# Patient Record
Sex: Male | Born: 1980 | Race: White | Hispanic: No | Marital: Married | Smoking: Current every day smoker
Health system: Southern US, Community
[De-identification: ages and names within clinical notes are randomized; demographics above are authoritative.]

## PROBLEM LIST (undated history)

## (undated) DIAGNOSIS — I10 Essential (primary) hypertension: Secondary | ICD-10-CM

## (undated) DIAGNOSIS — F909 Attention-deficit hyperactivity disorder, unspecified type: Secondary | ICD-10-CM

## (undated) DIAGNOSIS — E119 Type 2 diabetes mellitus without complications: Secondary | ICD-10-CM

## (undated) DIAGNOSIS — F419 Anxiety disorder, unspecified: Secondary | ICD-10-CM

## (undated) HISTORY — DX: Essential (primary) hypertension: I10

## (undated) HISTORY — DX: Attention-deficit hyperactivity disorder, unspecified type: F90.9

## (undated) HISTORY — DX: Type 2 diabetes mellitus without complications: E11.9

## (undated) HISTORY — DX: Anxiety disorder, unspecified: F41.9

---

## 2020-12-28 ENCOUNTER — Other Ambulatory Visit: Payer: Self-pay

## 2020-12-28 ENCOUNTER — Ambulatory Visit (INDEPENDENT_AMBULATORY_CARE_PROVIDER_SITE_OTHER): Payer: Self-pay

## 2020-12-28 ENCOUNTER — Ambulatory Visit (HOSPITAL_COMMUNITY)
Admission: EM | Admit: 2020-12-28 | Discharge: 2020-12-28 | Disposition: A | Discharge status: Home / Self Care | Payer: Self-pay | Attending: Urgent Care | Admitting: Urgent Care

## 2020-12-28 ENCOUNTER — Encounter (HOSPITAL_COMMUNITY): Payer: Self-pay | Admitting: *Deleted

## 2020-12-28 DIAGNOSIS — R059 Cough, unspecified: Secondary | ICD-10-CM

## 2020-12-28 DIAGNOSIS — B9789 Other viral agents as the cause of diseases classified elsewhere: Secondary | ICD-10-CM

## 2020-12-28 DIAGNOSIS — R051 Acute cough: Secondary | ICD-10-CM

## 2020-12-28 DIAGNOSIS — J988 Other specified respiratory disorders: Secondary | ICD-10-CM

## 2020-12-28 DIAGNOSIS — R062 Wheezing: Secondary | ICD-10-CM

## 2020-12-28 DIAGNOSIS — R0789 Other chest pain: Secondary | ICD-10-CM

## 2020-12-28 MED ORDER — ALBUTEROL SULFATE HFA 108 (90 BASE) MCG/ACT IN AERS: 1.0000 | INHALATION_SPRAY | Freq: Four times a day (QID) | RESPIRATORY_TRACT | 0 refills | Status: AC | PRN

## 2020-12-28 MED ORDER — PROMETHAZINE-DM 6.25-15 MG/5ML PO SYRP: 5.0000 mL | ORAL_SOLUTION | Freq: Every evening | ORAL | 0 refills | Status: DC | PRN

## 2020-12-28 MED ORDER — PREDNISONE 20 MG PO TABS: ORAL_TABLET | ORAL | 0 refills | Status: DC

## 2020-12-28 MED ORDER — BENZONATATE 100 MG PO CAPS: 100.0000 mg | ORAL_CAPSULE | Freq: Three times a day (TID) | ORAL | 0 refills | Status: DC | PRN

## 2020-12-28 NOTE — ED Provider Notes (Signed)
  [facility]   MRN: [id number] DOB: 06/26/1980  Subjective:   Jordan Madden is a 40 y.o. male presenting for 10-day history of acute onset persistent coughing, coughing fits, wheezing, chest tightness and chest pain especially with a cough.  Did an at-home COVID test and was negative.  Does not want a repeat.  He is a smoker, does 1/2ppd.  No history of COPD, asthma.  Reports that normally he has blood pressure runs high and also his pulse.  No body aches, sinus pain, facial pain.  No current facility-administered medications for this encounter. No current outpatient medications on file.   No Known Allergies  History reviewed. No pertinent past medical history.   History reviewed. No pertinent surgical history.  History reviewed. No pertinent family history.  Social History   Tobacco Use   Smoking status: Every Day   Smokeless tobacco: Never    ROS   Objective:   Vitals: BP (!) 146/104   Pulse (!) 113   Temp 99.2 F (37.3 C)   Resp 20   SpO2 94%   Physical Exam Constitutional:      General: He is not in acute distress.    Appearance: Normal appearance. He is well-developed. He is not ill-appearing, toxic-appearing or diaphoretic.  HENT:     Head: Normocephalic and atraumatic.     Right Ear: External ear normal.     Left Ear: External ear normal.     Nose: Nose normal.     Mouth/Throat:     Mouth: Mucous membranes are moist.     Pharynx: Oropharynx is clear.  Eyes:     General: No scleral icterus.    Extraocular Movements: Extraocular movements intact.     Pupils: Pupils are equal, round, and reactive to light.  Cardiovascular:     Rate and Rhythm: Normal rate and regular rhythm.     Heart sounds: Normal heart sounds. No murmur heard.   No friction rub. No gallop.  Pulmonary:     Effort: Pulmonary effort is normal. No respiratory distress.     Breath sounds: No stridor. Rhonchi (bilateral) present. No wheezing or rales.  Neurological:      Mental Status: He is alert and oriented to person, place, and time.  Psychiatric:        Mood and Affect: Mood normal.        Behavior: Behavior normal.        Thought Content: Thought content normal.    DG Chest 2 View  Result Date: 12/28/2020 CLINICAL DATA:  Coughing and wheezing for 10 days EXAM: CHEST - 2 VIEW COMPARISON:  None. FINDINGS: The heart size and mediastinal contours are within normal limits. Both lungs are clear. The visualized skeletal structures are unremarkable. IMPRESSION: No active cardiopulmonary disease. Electronically Signed   By: Elige Ko M.D.   On: 12/28/2020 16:08     Assessment and Plan :   PDMP not reviewed this encounter.  1. Acute cough   2. Wheezing   3. Atypical chest pain    In light of his wheezing, smoking recommended an oral prednisone course with an albuterol inhaler.  Use supportive care otherwise. Suspect patient is recovering from a viral respiratory infection.  He declined any further testing. Counseled patient on potential for adverse effects with medications prescribed/recommended today, ER and return-to-clinic precautions discussed, patient verbalized understanding.    Wallis Bamberg, [place] 12/28/20 1628

## 2020-12-28 NOTE — ED Triage Notes (Signed)
Pt reports a cough and wheezing for 10 days.

## 2021-05-10 ENCOUNTER — Ambulatory Visit (INDEPENDENT_AMBULATORY_CARE_PROVIDER_SITE_OTHER): Payer: 59

## 2021-05-10 ENCOUNTER — Ambulatory Visit
Admission: EM | Admit: 2021-05-10 | Discharge: 2021-05-10 | Disposition: A | Discharge status: Home / Self Care | Payer: 59 | Attending: Physician Assistant | Admitting: Physician Assistant

## 2021-05-10 DIAGNOSIS — R062 Wheezing: Secondary | ICD-10-CM

## 2021-05-10 DIAGNOSIS — I10 Essential (primary) hypertension: Secondary | ICD-10-CM | POA: Diagnosis not present

## 2021-05-10 DIAGNOSIS — J0141 Acute recurrent pansinusitis: Secondary | ICD-10-CM

## 2021-05-10 MED ORDER — AMOXICILLIN-POT CLAVULANATE 875-125 MG PO TABS: 1.0000 | ORAL_TABLET | Freq: Two times a day (BID) | ORAL | 0 refills | Status: DC

## 2021-05-10 MED ORDER — PROMETHAZINE-DM 6.25-15 MG/5ML PO SYRP: 5.0000 mL | ORAL_SOLUTION | Freq: Every evening | ORAL | 0 refills | Status: DC | PRN

## 2021-05-10 MED ORDER — AMLODIPINE BESYLATE 5 MG PO TABS: 5.0000 mg | ORAL_TABLET | Freq: Every day | ORAL | 0 refills | Status: DC

## 2021-05-10 NOTE — ED Triage Notes (Signed)
Pt c/o wheezing, nasal congestion, ear aches,  ? ?Denies cough, sore throat, headaches, nausea, vomiting,  ? ?Onset ~ 2 weeks ago  ? ?States had albuterol inhaler this afternoon and it provided relief. Interpreting the patients language it seems he is concerned that the few times he does cough there is a green-tinged mucous,  ?

## 2021-05-10 NOTE — Discharge Instructions (Signed)
Your x-ray was normal.  I believe that you have a sinus infection.  Please start Augmentin twice daily for 7 days.  Use Mucinex, Flonase, Tylenol for symptom relief.  Use humidifier and nasal saline for additional symptoms.  I have called in Promethazine DM for cough.  This can make you sleepy so take it only at night and do not drive or drink alcohol with taking it.  If you have any worsening symptoms you need to return immediately for further evaluation. ? ?Your blood pressure is very elevated.  I have started you on amlodipine 5 mg daily.  Please monitor your blood pressure at home.  Avoid NSAIDs including aspirin, ibuprofen/Advil, naproxen/Aleve.  Avoid decongestants, caffeine, sodium.  We will try to establish you with your primary care doctor.  Needs to recheck your blood pressure within a few weeks and if you are unable to see them in this timeframe please return here.  If you develop any chest pain, shortness of breath, headache, vision change, dizziness in the setting of high blood pressure you need to go to the emergency room. ?

## 2021-05-10 NOTE — ED Provider Notes (Addendum)
?[facility] ? ? ? ?CSN: [id number] ?Arrival date & time: 05/10/21  1815 ? ? ?  ? ?History   ?Chief Complaint ?Chief Complaint  ?Patient presents with  ? Cough  ? ? ?HPI ?Jordan Madden is a 41 y.o. male.  ? ?Patient presents today with a several month history of intermittent cough that has worsened over the past several weeks.  He was seen by our clinic December 2022 at which point he was told that he likely had a virus.  He reports symptoms improved but never completely resolved and has had worsening symptoms over the past several weeks.  He reports severe cough, nasal congestion, productive cough.  Denies any fever, chest pain, shortness of breath, nausea, vomiting.  He has not tried any over-the-counter medication for symptom management.  Denies history of asthma or COPD.  He is a current everyday smoker smoking approximately 0.5 packs/day as well as marijuana daily.  He has not had COVID in the past.  He denies any recent antibiotics or steroid use.  Denies history of diabetes. ? ?Blood pressure is elevated today.  Has been elevated at last visit as well.  Patient reports that his blood pressure and pulse are typically elevated when visiting the doctor.  Denies history of hypertension.  Does not take any antihypertensive medication.  Denies any chest pain, headache, dizziness, shortness of breath, visual disturbance.  Denies any significant NSAID, decongestant, caffeine, sodium use. ? ? ?History reviewed. No pertinent past medical history. ? ?There are no problems to display for this patient. ? ? ?History reviewed. No pertinent surgical history. ? ? ? ? ?Home Medications   ? ?Prior to Admission medications   ?Medication Sig Start Date End Date Taking? Authorizing Provider  ?amLODipine (NORVASC) 5 MG tablet Take 1 tablet (5 mg total) by mouth daily. 05/10/21  Yes Kainoa Swoboda K, PA-C  ?amoxicillin-clavulanate (AUGMENTIN) 875-125 MG tablet Take 1 tablet by mouth every 12 (twelve) hours. 05/10/21  Yes  Keilyn Haggard K, PA-C  ?albuterol (VENTOLIN HFA) 108 (90 Base) MCG/ACT inhaler Inhale 1-2 puffs into the lungs every 6 (six) hours as needed for wheezing or shortness of breath. 12/28/20   Jaynee Eagles, PA-C  ?promethazine-dextromethorphan (PROMETHAZINE-DM) 6.25-15 MG/5ML syrup Take 5 mLs by mouth at bedtime as needed for cough. 05/10/21   Janeann Paisley, Derry Skill, PA-C  ? ? ?Family History ?History reviewed. No pertinent family history. ? ?Social History ?Social History  ? ?Tobacco Use  ? Smoking status: Every Day  ?  Packs/day: 0.50  ?  Types: Cigarettes  ? Smokeless tobacco: Never  ? ? ? ?Allergies   ?Patient has no known allergies. ? ? ?Review of Systems ?Review of Systems  ?Constitutional:  Positive for activity change. Negative for appetite change, fatigue and fever.  ?HENT:  Positive for congestion and sinus pressure. Negative for sneezing and sore throat.   ?Respiratory:  Positive for cough. Negative for shortness of breath.   ?Cardiovascular:  Negative for chest pain.  ?Gastrointestinal:  Negative for abdominal pain, diarrhea, nausea and vomiting.  ?Neurological:  Negative for dizziness, light-headedness and headaches.  ? ? ?Physical Exam ?Triage Vital Signs ?ED Triage Vitals [05/10/21 1823]  ?Enc Vitals Group  ?   BP (!) 165/111  ?   Pulse Rate (!) 119  ?   Resp 18  ?   Temp 97.7 ?F (36.5 ?C)  ?   Temp Source Oral  ?   SpO2 95 %  ?   Weight   ?  Height   ?   Head Circumference   ?   Peak Flow   ?   Pain Score 0  ?   Pain Loc   ?   Pain Edu?   ?   Excl. in [place]?   ? ?No data found. ? ?Updated Vital Signs ?BP (!) 160/115   Pulse (!) 119   Temp 97.7 ?F (36.5 ?C) (Oral)   Resp 18   SpO2 95%  ? ?Visual Acuity ?Right Eye Distance:   ?Left Eye Distance:   ?Bilateral Distance:   ? ?Right Eye Near:   ?Left Eye Near:    ?Bilateral Near:    ? ?Physical Exam ?Vitals reviewed.  ?Constitutional:   ?   General: He is awake.  ?   Appearance: Normal appearance. He is well-developed. He is not ill-appearing.  ?   Comments: Very  pleasant male appears stated age in no acute distress sitting comfortably in exam room  ?HENT:  ?   Head: Normocephalic and atraumatic.  ?   Right Ear: Tympanic membrane, ear canal and external ear normal. Tympanic membrane is not erythematous or bulging.  ?   Left Ear: Tympanic membrane, ear canal and external ear normal. Tympanic membrane is not erythematous or bulging.  ?   Nose:  ?   Right Sinus: Maxillary sinus tenderness and frontal sinus tenderness present.  ?   Left Sinus: Maxillary sinus tenderness and frontal sinus tenderness present.  ?   Mouth/Throat:  ?   Dentition: Abnormal dentition. No dental abscesses.  ?   Pharynx: Uvula midline. Posterior oropharyngeal erythema present. No oropharyngeal exudate.  ?Cardiovascular:  ?   Rate and Rhythm: Normal rate and regular rhythm.  ?   Heart sounds: Normal heart sounds, S1 normal and S2 normal. No murmur heard. ?Pulmonary:  ?   Effort: Pulmonary effort is normal. No accessory muscle usage or respiratory distress.  ?   Breath sounds: No stridor. Rhonchi present. No wheezing or rales.  ?   Comments: Scattered rhonchi clear with cough ?Abdominal:  ?   General: Bowel sounds are normal.  ?   Palpations: Abdomen is soft.  ?   Tenderness: There is no abdominal tenderness.  ?Neurological:  ?   Mental Status: He is alert.  ?Psychiatric:     ?   Behavior: Behavior is cooperative.  ? ? ? ?UC Treatments / Results  ?Labs ?(all labs ordered are listed, but only abnormal results are displayed) ?Labs Reviewed - No data to display ? ?EKG ? ? ?Radiology ?DG Chest 2 View ? ?Result Date: 05/10/2021 ?CLINICAL DATA:  Wheezing with nasal congestion and ear ache. EXAM: CHEST - 2 VIEW COMPARISON:  December 28, 2020 FINDINGS: The heart size and mediastinal contours are within normal limits. Both lungs are clear. The visualized skeletal structures are unremarkable. IMPRESSION: No active cardiopulmonary disease. Electronically Signed   By: Virgina Norfolk M.D.   On: 05/10/2021 19:06    ? ?Procedures ?Procedures (including critical care time) ? ?Medications Ordered in UC ?Medications - No data to display ? ?Initial Impression / Assessment and Plan / UC Course  ?I have reviewed the triage vital signs and the nursing notes. ? ?Pertinent labs & imaging results that were available during my care of the patient were reviewed by me and considered in my medical decision making (see chart for details). ? ?  ? ?No indication for viral testing as patient has been symptomatic for several weeks and this would not change management.  X-ray was obtained that showed no acute cardiopulmonary disease.  We will start Augmentin given prolonged and worsening symptoms to cover for secondary sinus infection.  Patient was encouraged to use over-the-counter medications including Mucinex, Flonase, Tylenol for symptom relief.  Recommended humidifier and nasal saline for additional symptoms.  Discussed that he is to rest and drink plenty of fluid.  If symptoms or not improving within a week or if at any point anything worsens he develops high fever, chest pain, shortness of breath, worsening cough, nausea/vomiting interfere with oral intake he needs to be reevaluated immediately.  Strict return precautions given to which he expressed understanding. ? ?Blood pressure has been elevated at last several visits.  Patient reports this is his typical blood pressure.  We will start amlodipine 5 mg.  He was instructed to avoid NSAIDs, caffeine, sodium, decongestants.  He is to monitor his blood pressure at home and keep a log for evaluation of follow-up appointment.  We will try to establish him with primary care via PCP assistance.  Discussed that if he is unable to see them within a few weeks he should return here for blood pressure recheck.  If he develops any chest pain, shortness of breath, headache, vision change, dizziness in the setting of high blood pressure he needs to go to the emergency room to which he expressed  understanding. ? ?Patient was noted to be tachycardic.  He reports that he is chronically tachycardic with heart rate between 110-120.  Heart rate is stable compared to last visit.  Offered EKG and work-up but he declined this.

## 2022-05-12 ENCOUNTER — Ambulatory Visit: Payer: 59 | Admitting: Nurse Practitioner

## 2022-05-12 ENCOUNTER — Encounter: Payer: Self-pay | Admitting: Nurse Practitioner

## 2022-05-12 VITALS — BP 167/100 | HR 95 | Temp 98.0°F | Ht 68.0 in | Wt 273.4 lb

## 2022-05-12 DIAGNOSIS — L309 Dermatitis, unspecified: Secondary | ICD-10-CM | POA: Diagnosis not present

## 2022-05-12 DIAGNOSIS — Z716 Tobacco abuse counseling: Secondary | ICD-10-CM | POA: Insufficient documentation

## 2022-05-12 DIAGNOSIS — I10 Essential (primary) hypertension: Secondary | ICD-10-CM

## 2022-05-12 DIAGNOSIS — F909 Attention-deficit hyperactivity disorder, unspecified type: Secondary | ICD-10-CM

## 2022-05-12 MED ORDER — LISINOPRIL 10 MG PO TABS: 10.0000 mg | ORAL_TABLET | Freq: Every day | ORAL | 1 refills | Status: DC

## 2022-05-12 MED ORDER — TRIAMCINOLONE ACETONIDE 0.1 % EX CREA: 1.0000 | TOPICAL_CREAM | Freq: Two times a day (BID) | CUTANEOUS | 0 refills | Status: DC

## 2022-05-12 MED ORDER — AMLODIPINE BESYLATE 5 MG PO TABS: 5.0000 mg | ORAL_TABLET | Freq: Every day | ORAL | 1 refills | Status: DC

## 2022-05-12 NOTE — Assessment & Plan Note (Signed)
Kenalog 0.1% currently apply twice daily to affected sites Avoid scratching to prevent infection

## 2022-05-12 NOTE — Assessment & Plan Note (Signed)
Smokes about 0.5 pack/day  Asked about quitting: confirms that he/she currently smokes cigarettes Advise to quit smoking: Educated about QUITTING to reduce the risk of cancer, cardio and cerebrovascular disease. Assess willingness: Unwilling to quit at this time, but is working on cutting back. Assist with counseling and pharmacotherapy: Counseled for 5 minutes and literature provided. Arrange for follow up: follow up in 1 months and continue to offer help.  

## 2022-05-12 NOTE — Patient Instructions (Signed)
1. Attention deficit hyperactivity disorder (ADHD), unspecified ADHD type  - Ambulatory referral to Psychiatry  2. Hypertelorism  - amLODipine (NORVASC) 5 MG tablet; Take 1 tablet (5 mg total) by mouth daily.  Dispense: 90 tablet; Refill: 1 - lisinopril (ZESTRIL) 10 MG tablet; Take 1 tablet (10 mg total) by mouth daily.  Dispense: 90 tablet; Refill: 1 - CMP14+EGFR  3. Dermatitis  - triamcinolone cream (KENALOG) 0.1 %; Apply 1 Application topically 2 (two) times daily.  Dispense: 30 g; Refill: 0   Around 3 times per week, check your blood pressure 2 times per day. once in the morning and once in the evening. The readings should be at least one minute apart. Write down these values and bring them to your next nurse visit/appointment.  When you check your BP, make sure you have been doing something calm/relaxing 5 minutes prior to checking. Both feet should be flat on the floor and you should be sitting. Use your left arm and make sure it is in a relaxed position (on a table), and that the cuff is at the approximate level/height of your heart.   Keep a BP log and bring to next appointment     It is important that you exercise regularly at least 30 minutes 5 times a week as tolerated  Think about what you will eat, plan ahead. Choose " clean, green, fresh or frozen" over canned, processed or packaged foods which are more sugary, salty and fatty. 70 to 75% of food eaten should be vegetables and fruit. Three meals at set times with snacks allowed between meals, but they must be fruit or vegetables. Aim to eat over a 12 hour period , example 7 am to 7 pm, and STOP after  your last meal of the day. Drink water,generally about 64 ounces per day, no other drink is as healthy. Fruit juice is best enjoyed in a healthy way, by EATING the fruit.  Thanks for choosing [facility] we consider it a privelige to serve you.

## 2022-05-12 NOTE — Assessment & Plan Note (Signed)
BP Readings from Last 3 Encounters:  05/12/22 (!) 167/100  05/10/21 (!) 160/115  12/28/20 (!) 146/104  Amlodipine 5 mg daily, lisinopril 10 mg daily ordered Monitor blood pressure at least 3 times weekly at home keep a log and bring to next visit  Discussed DASH diet and dietary sodium restrictions Continue to increase dietary efforts and exercise.  CMP today Follow-up in 4 weeks

## 2022-05-12 NOTE — Assessment & Plan Note (Signed)
Takes Adderall 20 mg 4 times daily Patient referred to psychiatrist for medication management

## 2022-05-12 NOTE — Progress Notes (Signed)
New Patient Office Visit  Subjective:  Patient ID: Jordan Madden, male    DOB: 03/05/1980  Age: 42 y.o. MRN: [id number]  CC:  Chief Complaint  Patient presents with   Establish Care    Hypertension.     HPI [name] is a 42 y.o. male  has a past medical history of ADHD, Anxiety, and High blood pressure.  Patient presents to establish care for his chronic medical conditions.  He moved from [place] over a year ago.  Previous PCP was at  [facility] in [place].  Records requested from previous PCP   Hypertension.  Patient was on amlodipine 5 mg daily, lisinopril 20 mg daily.  But he has been out of both medications for a while.  He denies chest pain, dizziness, edema.  Patient reports that he is on a general diet goes to the gym 5 days a week.  Currently on Suboxone 20 mg daily medication managed by Dr.Liu the telehealth.  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  He currently denies pain.  ADHD.  Adderall was being managed by psychiatrist at [facility] , via telehealth.  Since his provider left the practice and he was not pleased with the care he was getting there, he would like a referral to another  psychiatrist for medication management.  He was on Zoloft and Effexor in the past for anxiety.  Currently denies anxiety.  Smokes about half pack of cigarettes daily since age 62.  Patient denies shortness of breath, cough, wheezing   Complains of itchy rashes to bilateral shin since about a week ago.  Symptoms had started after playing golf while wearing shorts.  No complaints of fever, chills, malaise     Past Medical History:  Diagnosis Date   ADHD    Anxiety    High blood pressure     History reviewed. No pertinent surgical history.  Family History  Problem Relation Age of Onset   Alcoholism Mother    Alcoholism Father    Colon cancer Neg Hx    Cancer - Lung Neg Hx     Social History    Socioeconomic History   Marital status: Married    Spouse name: Not on file   Number of children: Not on file   Years of education: Not on file   Highest education level: Not on file  Occupational History   Not on file  Tobacco Use   Smoking status: Every Day    Packs/day: .5    Types: Cigarettes   Smokeless tobacco: Never  Substance and Sexual Activity   Alcohol use: Not Currently   Drug use: Never   Sexual activity: Yes  Other Topics Concern   Not on file  Social History Narrative   Lives with his spouse    Social Determinants of Health   Financial Resource Strain: Not on file  Food Insecurity: Not on file  Transportation Needs: Not on file  Physical Activity: Not on file  Stress: Not on file  Social Connections: Not on file  Intimate Partner Violence: Not on file    ROS Review of Systems  Constitutional: Negative.  Negative for activity change, appetite change, chills, diaphoresis and fatigue.  Respiratory: Negative.    Cardiovascular: Negative.   Genitourinary: Negative.   Musculoskeletal: Negative.   Skin:  Positive for rash.  Neurological: Negative.   Psychiatric/Behavioral:  Negative for agitation, behavioral problems, confusion, hallucinations, self-injury and suicidal  ideas.     Objective:   Today's Vitals: BP (!) 167/100   Pulse 95   Temp 98 F (36.7 C)   Ht 5\' 8"  (1.727 m)   Wt 273 lb 6.4 oz (124 kg)   SpO2 100%   BMI 41.57 kg/m   Physical Exam Constitutional:      General: He is not in acute distress.    Appearance: He is obese. He is not ill-appearing, toxic-appearing or diaphoretic.  Eyes:     General: No scleral icterus.       Right eye: No discharge.        Left eye: No discharge.     Extraocular Movements: Extraocular movements intact.  Cardiovascular:     Rate and Rhythm: Normal rate and regular rhythm.     Pulses: Normal pulses.     Heart sounds: Normal heart sounds. No murmur heard.    No friction rub. No gallop.   Pulmonary:     Effort: Pulmonary effort is normal. No respiratory distress.     Breath sounds: Normal breath sounds. No stridor. No wheezing, rhonchi or rales.  Chest:     Chest wall: No tenderness.  Abdominal:     General: There is no distension.     Palpations: Abdomen is soft.     Tenderness: There is no abdominal tenderness. There is no guarding.  Musculoskeletal:        General: No swelling, deformity or signs of injury.     Right lower leg: No edema.     Left lower leg: No edema.  Skin:    General: Skin is warm and dry.     Capillary Refill: Capillary refill takes less than 2 seconds.     Findings: Rash present.     Comments: Multiple erythematous rashes noted on bilateral shins.  No drainage or swelling noted  Neurological:     Mental Status: He is alert and oriented to person, place, and time.     Sensory: No sensory deficit.     Motor: No weakness.     Coordination: Coordination normal.     Gait: Gait normal.  Psychiatric:        Mood and Affect: Mood normal.        Behavior: Behavior normal.        Thought Content: Thought content normal.        Judgment: Judgment normal.     Assessment & Plan:   Problem List Items Addressed This Visit       Cardiovascular and Mediastinum   High blood pressure - Primary    BP Readings from Last 3 Encounters:  05/12/22 (!) 167/100  05/10/21 (!) 160/115  12/28/20 (!) 146/104  Amlodipine 5 mg daily, lisinopril 10 mg daily ordered Monitor blood pressure at least 3 times weekly at home keep a log and bring to next visit  Discussed DASH diet and dietary sodium restrictions Continue to increase dietary efforts and exercise.  CMP today Follow-up in 4 weeks        Relevant Medications   amLODipine (NORVASC) 5 MG tablet   lisinopril (ZESTRIL) 10 MG tablet   Other Relevant Orders   CMP14+EGFR     Musculoskeletal and Integument   Dermatitis    Kenalog 0.1% currently apply twice daily to affected sites Avoid scratching to  prevent infection      Relevant Medications   triamcinolone cream (KENALOG) 0.1 %     Other   Tobacco abuse counseling  Smokes about 0.5 pack/day  Asked about quitting: confirms that he/she currently smokes cigarettes Advise to quit smoking: Educated about QUITTING to reduce the risk of cancer, cardio and cerebrovascular disease. Assess willingness: Unwilling to quit at this time, but is working on cutting back. Assist with counseling and pharmacotherapy: Counseled for 5 minutes and literature provided. Arrange for follow up: follow up in 1 months and continue to offer help.       Attention deficit hyperactivity disorder (ADHD)    Takes Adderall 20 mg 4 times daily Patient referred to psychiatrist for medication management      Relevant Orders   Ambulatory referral to Psychiatry    Outpatient Encounter Medications as of 05/12/2022  Medication Sig   amphetamine-dextroamphetamine (ADDERALL) 20 MG tablet Take 20 mg by mouth 4 (four) times daily.   lisinopril (ZESTRIL) 10 MG tablet Take 1 tablet (10 mg total) by mouth daily.   SUBOXONE 8-2 MG FILM SMARTSIG:2.5 Strip(s) Sublingual Daily   triamcinolone cream (KENALOG) 0.1 % Apply 1 Application topically 2 (two) times daily.   albuterol (VENTOLIN HFA) 108 (90 Base) MCG/ACT inhaler Inhale 1-2 puffs into the lungs every 6 (six) hours as needed for wheezing or shortness of breath. (Patient not taking: Reported on 05/12/2022)   amLODipine (NORVASC) 5 MG tablet Take 1 tablet (5 mg total) by mouth daily.   [DISCONTINUED] amLODipine (NORVASC) 5 MG tablet Take 1 tablet (5 mg total) by mouth daily. (Patient not taking: Reported on 05/12/2022)   [DISCONTINUED] amoxicillin-clavulanate (AUGMENTIN) 875-125 MG tablet Take 1 tablet by mouth every 12 (twelve) hours. (Patient not taking: Reported on 05/12/2022)   [DISCONTINUED] promethazine-dextromethorphan (PROMETHAZINE-DM) 6.25-15 MG/5ML syrup Take 5 mLs by mouth at bedtime as needed for cough. (Patient  not taking: Reported on 05/12/2022)   No facility-administered encounter medications on file as of 05/12/2022.    Follow-up: Return in about 4 weeks (around 06/09/2022) for CPE/ HTN.   Donell Beers, FNP

## 2022-05-13 ENCOUNTER — Other Ambulatory Visit: Payer: Self-pay | Admitting: Nurse Practitioner

## 2022-05-13 DIAGNOSIS — Z131 Encounter for screening for diabetes mellitus: Secondary | ICD-10-CM

## 2022-05-13 LAB — CMP14+EGFR
ALT: 34 IU/L (ref 0–44)
AST: 25 IU/L (ref 0–40)
Albumin/Globulin Ratio: 1.4 (ref 1.2–2.2)
Albumin: 4 g/dL — ABNORMAL LOW (ref 4.1–5.1)
Alkaline Phosphatase: 136 IU/L — ABNORMAL HIGH (ref 44–121)
BUN/Creatinine Ratio: 16 (ref 9–20)
BUN: 11 mg/dL (ref 6–24)
Bilirubin Total: 0.5 mg/dL (ref 0.0–1.2)
CO2: 24 mmol/L (ref 20–29)
Calcium: 9.2 mg/dL (ref 8.7–10.2)
Chloride: 97 mmol/L (ref 96–106)
Creatinine, Ser: 0.68 mg/dL — ABNORMAL LOW (ref 0.76–1.27)
Globulin, Total: 2.9 g/dL (ref 1.5–4.5)
Glucose: 239 mg/dL — ABNORMAL HIGH (ref 70–99)
Potassium: 4.3 mmol/L (ref 3.5–5.2)
Sodium: 139 mmol/L (ref 134–144)
Total Protein: 6.9 g/dL (ref 6.0–8.5)
eGFR: 118 mL/min/{1.73_m2} (ref >59)

## 2022-05-19 ENCOUNTER — Other Ambulatory Visit: Payer: Self-pay | Admitting: Nurse Practitioner

## 2022-05-19 ENCOUNTER — Other Ambulatory Visit: Payer: Self-pay

## 2022-05-19 MED ORDER — AMPHETAMINE-DEXTROAMPHETAMINE 30 MG PO TABS: 30.0000 mg | ORAL_TABLET | Freq: Two times a day (BID) | ORAL | 0 refills | Status: DC

## 2022-05-19 NOTE — Telephone Encounter (Signed)
Please advise KH 

## 2022-05-19 NOTE — Progress Notes (Signed)
PDMP reviewed.  I have discussed with the patient that Adderall will only be refilled for 30 days, afterwards he will need to get the medications from psychiatrist  Adderall 30 mg twice daily ordered.  Currently getting 90 mg dose but I discussed with the patient that this is a a high dose that is beyond the recommended guidelines

## 2022-05-19 NOTE — Telephone Encounter (Signed)
From: Lloyd Huger To: Office of Donell Beers, [place] Sent: 05/17/2022 10:15 PM EDT Subject: Medication Renewal Request  Refills have been requested for the following medications:   amphetamine-dextroamphetamine (ADDERALL) 20 MG tablet  Patient Comment: I have a mental health appt on May 29 with new provider and respectfully request a bridge prescription until I have my new psych appt.   Preferred pharmacy: [facility] Delivery method: Daryll Drown Preferred pick-up date and time: 05/20/2022 12:00 PM

## 2022-06-09 ENCOUNTER — Ambulatory Visit: Payer: 59 | Admitting: Nurse Practitioner

## 2022-06-09 ENCOUNTER — Other Ambulatory Visit: Payer: Self-pay

## 2022-06-09 ENCOUNTER — Encounter: Payer: Self-pay | Admitting: Nurse Practitioner

## 2022-06-09 ENCOUNTER — Other Ambulatory Visit: Payer: Self-pay | Admitting: Nurse Practitioner

## 2022-06-09 VITALS — BP 158/106 | HR 98 | Ht 68.0 in | Wt 263.0 lb

## 2022-06-09 DIAGNOSIS — E669 Obesity, unspecified: Secondary | ICD-10-CM | POA: Diagnosis not present

## 2022-06-09 DIAGNOSIS — Z720 Tobacco use: Secondary | ICD-10-CM

## 2022-06-09 DIAGNOSIS — L309 Dermatitis, unspecified: Secondary | ICD-10-CM

## 2022-06-09 DIAGNOSIS — E1165 Type 2 diabetes mellitus with hyperglycemia: Secondary | ICD-10-CM | POA: Diagnosis not present

## 2022-06-09 DIAGNOSIS — I10 Essential (primary) hypertension: Secondary | ICD-10-CM

## 2022-06-09 DIAGNOSIS — F909 Attention-deficit hyperactivity disorder, unspecified type: Secondary | ICD-10-CM

## 2022-06-09 DIAGNOSIS — F1290 Cannabis use, unspecified, uncomplicated: Secondary | ICD-10-CM | POA: Insufficient documentation

## 2022-06-09 DIAGNOSIS — Z131 Encounter for screening for diabetes mellitus: Secondary | ICD-10-CM | POA: Insufficient documentation

## 2022-06-09 LAB — POCT GLYCOSYLATED HEMOGLOBIN (HGB A1C): Hemoglobin A1C: 11.1 % — AB (ref 4.0–5.6)

## 2022-06-09 MED ORDER — BLOOD GLUCOSE MONITORING SUPPL DEVI: 1.0000 | Freq: Three times a day (TID) | 0 refills | Status: AC

## 2022-06-09 MED ORDER — AMPHETAMINE-DEXTROAMPHETAMINE 30 MG PO TABS: 30.0000 mg | ORAL_TABLET | Freq: Two times a day (BID) | ORAL | 0 refills | Status: DC

## 2022-06-09 MED ORDER — METFORMIN HCL ER 500 MG PO TB24: 500.0000 mg | ORAL_TABLET | Freq: Two times a day (BID) | ORAL | 3 refills | Status: DC

## 2022-06-09 MED ORDER — BLOOD GLUCOSE TEST VI STRP: 1.0000 | ORAL_STRIP | Freq: Three times a day (TID) | 0 refills | Status: AC

## 2022-06-09 MED ORDER — LANCETS MISC. MISC: 1.0000 | Freq: Three times a day (TID) | 0 refills | Status: AC

## 2022-06-09 MED ORDER — LANCET DEVICE MISC: 1.0000 | Freq: Three times a day (TID) | 0 refills | Status: AC

## 2022-06-09 MED ORDER — BLOOD PRESSURE MONITOR AUTOMAT DEVI: 0 refills | Status: AC

## 2022-06-09 MED ORDER — SEMAGLUTIDE(0.25 OR 0.5MG/DOS) 2 MG/3ML ~~LOC~~ SOPN: 0.2500 mg | PEN_INJECTOR | SUBCUTANEOUS | 0 refills | Status: DC

## 2022-06-09 NOTE — Telephone Encounter (Signed)
From: Jordan Madden To: Office of Jordan Madden, [place] Sent: 06/08/2022 3:37 PM EDT Subject: Medication Renewal Request  Refills have been requested for the following medications:   amphetamine-dextroamphetamine (ADDERALL) 30 MG tablet [[employer] [facility] [employer]]  Patient Comment: This dose is 2/3 normal dose I have been taking for 28 years. I have my full pharmacy medical records since I was 16 and will give to doctor at visit.   Preferred pharmacy: [id number] - [place], [place] - [address] AT [facility] OF [facility] & [address] Delivery method: Daryll Drown

## 2022-06-09 NOTE — Assessment & Plan Note (Addendum)
Wt Readings from Last 3 Encounters:  06/09/22 263 lb (119.3 kg)  05/12/22 273 lb 6.4 oz (124 kg)  Patient has lost about 10 pounds since last visit Patient counseled on low-carb diet Encouraged to engage in regular moderate to vigorous exercise of at least 150 minutes weekly Benefits of healthy weight discussed

## 2022-06-09 NOTE — Telephone Encounter (Signed)
Please advise KH 

## 2022-06-09 NOTE — Assessment & Plan Note (Signed)
Lab Results  Component Value Date   HGBA1C 11.1 (A) 06/09/2022  No diagnosis of type 2 diabetes Start metformin 500 mg twice daily Ozempic 0.25 mg once weekly injection, he denies personal or family history of thyroid medullary cancer, history of pancreatitis.  He was encouraged to avoid fatty fried foods while taking medication to prevent nausea Patient counseled on low-carb modified diet CBG goals discussed with the patient Patient referred to for diabetes education Checking urine microalbumin labs Lipid panel at next visit Follow-up in 4 weeks

## 2022-06-09 NOTE — Assessment & Plan Note (Signed)
Need to avoid smoking marijuana including risk of addiction to illicit drugs discussed 

## 2022-06-09 NOTE — Progress Notes (Signed)
Established Patient Office Visit  Subjective:  Patient ID: Jordan Madden, male    DOB: [birth year]  Age: 42 y.o. MRN: [id number]  CC:  Chief Complaint  Patient presents with   Follow-up    HPI Jordan Madden is a 42 y.o. male  has a past medical history of ADHD, Anxiety, and High blood pressure.  Patient presents for follow-up for his chronic medical conditions  Hypertension.  Lisinopril 10 mg daily, amlodipine 5 mg daily was ordered at his previous visit.  Patient reports that he has been taking only lisinopril.  Patient encouraged to start taking both medications as ordered. He denies CP, edema, HA.  He has not been checking his blood pressure, does a lot of walking  He has upcoming appointment with psychiatrist on 07/03/2022.  Needs a refill of Adderall to last him today.  No more rashes   Past Medical History:  Diagnosis Date   ADHD    Anxiety    High blood pressure     History reviewed. No pertinent surgical history.  Family History  Problem Relation Age of Onset   Alcoholism Mother    Alcoholism Father    Colon cancer Neg Hx    Cancer - Lung Neg Hx     Social History   Socioeconomic History   Marital status: Married    Spouse name: Not on file   Number of children: Not on file   Years of education: Not on file   Highest education level: Master's degree (e.g., MA, MS, MEng, MEd, MSW, MBA)  Occupational History   Not on file  Tobacco Use   Smoking status: Every Day    Packs/day: .5    Types: Cigarettes   Smokeless tobacco: Never  Substance and Sexual Activity   Alcohol use: Not Currently   Drug use: Never   Sexual activity: Yes  Other Topics Concern   Not on file  Social History Narrative   Lives with his spouse    Social Determinants of Health   Financial Resource Strain: Medium Risk (06/08/2022)   Overall Financial Resource Strain (CARDIA)    Difficulty of Paying Living Expenses: Somewhat hard  Food Insecurity: Food Insecurity Present (06/08/2022)    Hunger Vital Sign    Worried About Running Out of Food in the Last Year: Sometimes true    Ran Out of Food in the Last Year: Sometimes true  Transportation Needs: No Transportation Needs (06/08/2022)   PRAPARE - Administrator, Civil Service (Medical): No    Lack of Transportation (Non-Medical): No  Physical Activity: Insufficiently Active (06/08/2022)   Exercise Vital Sign    Days of Exercise per Week: 3 days    Minutes of Exercise per Session: 20 min  Stress: No Stress Concern Present (06/08/2022)   [employer] of Occupational Health - Occupational Stress Questionnaire    Feeling of Stress : Only a little  Social Connections: Moderately Integrated (06/08/2022)   Social Connection and Isolation Panel [NHANES]    Frequency of Communication with Friends and Family: Twice a week    Frequency of Social Gatherings with Friends and Family: Once a week    Attends Religious Services: 1 to 4 times per year    Active Member of [employer] or Organizations: No    Attends Engineer, structural: Not on file    Marital Status: Married  Catering manager Violence: Not on file    Outpatient Medications Prior to Visit  Medication Sig Dispense Refill  albuterol (VENTOLIN HFA) 108 (90 Base) MCG/ACT inhaler Inhale 1-2 puffs into the lungs every 6 (six) hours as needed for wheezing or shortness of breath. 18 g 0   lisinopril (ZESTRIL) 10 MG tablet Take 1 tablet (10 mg total) by mouth daily. 90 tablet 1   SUBOXONE 8-2 MG FILM SMARTSIG:2.5 Strip(s) Sublingual Daily     triamcinolone cream (KENALOG) 0.1 % Apply 1 Application topically 2 (two) times daily. 30 g 0   amphetamine-dextroamphetamine (ADDERALL) 30 MG tablet Take 1 tablet by mouth 2 (two) times daily. 60 tablet 0   amLODipine (NORVASC) 5 MG tablet Take 1 tablet (5 mg total) by mouth daily. (Patient not taking: Reported on 06/09/2022) 90 tablet 1   No facility-administered medications prior to visit.    Allergies  Allergen  Reactions   Bee Pollen     ROS Review of Systems  Constitutional:  Negative for activity change, appetite change, chills, diaphoresis, fatigue, fever and unexpected weight change.  HENT:  Negative for congestion, dental problem, drooling and ear discharge.   Eyes:  Negative for pain, discharge, redness and itching.  Respiratory:  Negative for apnea, cough, choking, chest tightness, shortness of breath and wheezing.   Cardiovascular: Negative.  Negative for chest pain, palpitations and leg swelling.  Gastrointestinal:  Negative for abdominal distention, abdominal pain, anal bleeding, blood in stool, constipation, diarrhea and vomiting.  Endocrine: Negative for polydipsia, polyphagia and polyuria.  Genitourinary:  Negative for difficulty urinating, flank pain, frequency and genital sores.  Musculoskeletal: Negative.  Negative for arthralgias, back pain, gait problem and joint swelling.  Skin:  Negative for color change, pallor and rash.  Neurological:  Negative for dizziness, facial asymmetry, light-headedness, numbness and headaches.  Psychiatric/Behavioral:  Negative for agitation, behavioral problems, confusion, hallucinations, self-injury, sleep disturbance and suicidal ideas.       Objective:    Physical Exam Vitals and nursing note reviewed.  Constitutional:      General: He is not in acute distress.    Appearance: Normal appearance. He is obese. He is not ill-appearing, toxic-appearing or diaphoretic.  HENT:     Mouth/Throat:     Mouth: Mucous membranes are moist.     Pharynx: Oropharynx is clear. No oropharyngeal exudate or posterior oropharyngeal erythema.  Eyes:     General: No scleral icterus.       Right eye: No discharge.        Left eye: No discharge.     Extraocular Movements: Extraocular movements intact.     Conjunctiva/sclera: Conjunctivae normal.  Cardiovascular:     Rate and Rhythm: Normal rate and regular rhythm.     Pulses: Normal pulses.     Heart sounds:  Normal heart sounds. No murmur heard.    No friction rub. No gallop.  Pulmonary:     Effort: Pulmonary effort is normal. No respiratory distress.     Breath sounds: Normal breath sounds. No stridor. No wheezing, rhonchi or rales.  Chest:     Chest wall: No tenderness.  Abdominal:     General: There is no distension.     Palpations: Abdomen is soft.     Tenderness: There is no abdominal tenderness. There is no right CVA tenderness, left CVA tenderness or guarding.  Musculoskeletal:        General: No swelling, tenderness, deformity or signs of injury.     Right lower leg: No edema.     Left lower leg: No edema.  Skin:    General: Skin is  warm and dry.     Capillary Refill: Capillary refill takes less than 2 seconds.     Coloration: Skin is not jaundiced or pale.     Findings: No bruising, erythema or lesion.  Neurological:     Mental Status: He is alert and oriented to person, place, and time.     Motor: No weakness.     Coordination: Coordination normal.     Gait: Gait normal.  Psychiatric:        Mood and Affect: Mood normal.        Behavior: Behavior normal.        Thought Content: Thought content normal.        Judgment: Judgment normal.     BP (!) 158/106   Pulse 98   Ht 5\' 8"  (1.727 m)   Wt 263 lb (119.3 kg)   SpO2 99%   BMI 39.99 kg/m  Wt Readings from Last 3 Encounters:  06/09/22 263 lb (119.3 kg)  05/12/22 273 lb 6.4 oz (124 kg)    No results found for: "TSH" No results found for: "WBC", "HGB", "HCT", "MCV", "PLT" Lab Results  Component Value Date   NA 139 05/12/2022   K 4.3 05/12/2022   CO2 24 05/12/2022   GLUCOSE 239 (H) 05/12/2022   BUN 11 05/12/2022   CREATININE 0.68 (L) 05/12/2022   BILITOT 0.5 05/12/2022   ALKPHOS 136 (H) 05/12/2022   AST 25 05/12/2022   ALT 34 05/12/2022   PROT 6.9 05/12/2022   ALBUMIN 4.0 (L) 05/12/2022   CALCIUM 9.2 05/12/2022   EGFR 118 05/12/2022   No results found for: "CHOL" No results found for: "HDL" No results  found for: "LDLCALC" No results found for: "TRIG" No results found for: "CHOLHDL" Lab Results  Component Value Date   HGBA1C 11.1 (A) 06/09/2022      Assessment & Plan:   Problem List Items Addressed This Visit       Cardiovascular and Mediastinum   High blood pressure    BP Readings from Last 3 Encounters:  06/09/22 (!) 158/106  05/12/22 (!) 167/100  05/10/21 (!) 160/115  Hypertension uncontrolled currently on lisinopril 10 mg daily, he has not been taking amlodipine 5 mg tablet, patient encouraged to pick up the prescription for amlodipine and start taking daily as ordered.  Continue lisinopril 10 mg daily Monitor blood pressure at home keep a log and bring to his next appointment in 4 weeks.l blood pressure goal is less than 130/80 Discussed DASH diet and dietary sodium restrictions Continue to increase dietary efforts and exercise.  Smoking cessation encouraged BMP today      Relevant Orders   Basic Metabolic Panel     Endocrine   Uncontrolled type 2 diabetes mellitus with hyperglycemia (HCC) - Primary    Lab Results  Component Value Date   HGBA1C 11.1 (A) 06/09/2022  No diagnosis of type 2 diabetes Start metformin 500 mg twice daily Ozempic 0.25 mg once weekly injection, he denies personal or family history of thyroid medullary cancer, history of pancreatitis.  He was encouraged to avoid fatty fried foods while taking medication to prevent nausea Patient counseled on low-carb modified diet CBG goals discussed with the patient Patient referred to for diabetes education Checking urine microalbumin labs Lipid panel at next visit Follow-up in 4 weeks      Relevant Medications   metFORMIN (GLUCOPHAGE-XR) 500 MG 24 hr tablet   Semaglutide,0.25 or 0.5MG /DOS, 2 MG/3ML SOPN   Other Relevant Orders  POCT glycosylated hemoglobin (Hb A1C) (Completed)   Amb Referral to Nutrition and Diabetic Education   Microalbumin / creatinine urine ratio     Musculoskeletal and  Integument   Dermatitis    Now resolved        Other   Attention deficit hyperactivity disorder (ADHD)    Has upcoming appointment with psychiatrist Adderall refilled      Relevant Medications   amphetamine-dextroamphetamine (ADDERALL) 30 MG tablet (Start on 06/18/2022)   Obesity (BMI 30-39.9)    Wt Readings from Last 3 Encounters:  06/09/22 263 lb (119.3 kg)  05/12/22 273 lb 6.4 oz (124 kg)  Patient has lost about 10 pounds since last visit Patient counseled on low-carb diet Encouraged to engage in regular moderate to vigorous exercise of at least 150 minutes weekly Benefits of healthy weight discussed      Relevant Medications   metFORMIN (GLUCOPHAGE-XR) 500 MG 24 hr tablet   Semaglutide,0.25 or 0.5MG /DOS, 2 MG/3ML SOPN   amphetamine-dextroamphetamine (ADDERALL) 30 MG tablet (Start on 06/18/2022)   Tobacco abuse    Smokes half pack of cigarettes daily Smoking cessation encouraged      Marijuana user    Need to avoid smoking marijuana including risk of addiction to illicit drugs discussed       Meds ordered this encounter  Medications   metFORMIN (GLUCOPHAGE-XR) 500 MG 24 hr tablet    Sig: Take 1 tablet (500 mg total) by mouth 2 (two) times daily with a meal.    Dispense:  60 tablet    Refill:  3   Semaglutide,0.25 or 0.5MG /DOS, 2 MG/3ML SOPN    Sig: Inject 0.25 mg into the skin once a week.    Dispense:  3 mL    Refill:  0   amphetamine-dextroamphetamine (ADDERALL) 30 MG tablet    Sig: Take 1 tablet by mouth 2 (two) times daily.    Dispense:  60 tablet    Refill:  0    Follow-up: Return in about 4 weeks (around 07/07/2022) for DM, HTN.    Donell Beers, FNP

## 2022-06-09 NOTE — Assessment & Plan Note (Signed)
Smokes half pack of cigarettes daily Smoking cessation encouraged

## 2022-06-09 NOTE — Assessment & Plan Note (Signed)
Now resolved.  

## 2022-06-09 NOTE — Assessment & Plan Note (Addendum)
BP Readings from Last 3 Encounters:  06/09/22 (!) 158/106  05/12/22 (!) 167/100  05/10/21 (!) 160/115  Hypertension uncontrolled currently on lisinopril 10 mg daily, he has not been taking amlodipine 5 mg tablet, patient encouraged to pick up the prescription for amlodipine and start taking daily as ordered.  Continue lisinopril 10 mg daily Monitor blood pressure at home keep a log and bring to his next appointment in 4 weeks.l blood pressure goal is less than 130/80 Discussed DASH diet and dietary sodium restrictions Continue to increase dietary efforts and exercise.  Smoking cessation encouraged BMP today

## 2022-06-09 NOTE — Assessment & Plan Note (Addendum)
Has upcoming appointment with psychiatrist Adderall refilled

## 2022-06-09 NOTE — Patient Instructions (Signed)
  Nurse please order a blood pressure cuff, BMP today, glucometer.   Marland Kitchen Uncontrolled type 2 diabetes mellitus with hyperglycemia (HCC)  - POCT glycosylated hemoglobin (Hb A1C) - metFORMIN (GLUCOPHAGE-XR) 500 MG 24 hr tablet; Take 1 tablet (500 mg total) by mouth 2 (two) times daily with a meal.  Dispense: 60 tablet; Refill: 3 - Semaglutide,0.25 or 0.5MG /DOS, 2 MG/3ML SOPN; Inject 0.25 mg into the skin once a week.  Dispense: 3 mL; Refill: 0 - Amb Referral to Nutrition and Diabetic Education - Microalbumin / creatinine urine ratio    Goal for fasting blood sugar ranges from 80 to 120 and 2 hours after any meal or at bedtime should be between 130 to 170.    Around 3 times per week, check your blood pressure 2 times per day. once in the morning and once in the evening. The readings should be at least one minute apart. Write down these values and bring them to your next nurse visit/appointment.  When you check your BP, make sure you have been doing something calm/relaxing 5 minutes prior to checking. Both feet should be flat on the floor and you should be sitting. Use your left arm and make sure it is in a relaxed position (on a table), and that the cuff is at the approximate level/height of your heart.   Blood pressure goal is less than 130/80   It is important that you exercise regularly at least 30 minutes 5 times a week as tolerated  Think about what you will eat, plan ahead. Choose " clean, green, fresh or frozen" over canned, processed or packaged foods which are more sugary, salty and fatty. 70 to 75% of food eaten should be vegetables and fruit. Three meals at set times with snacks allowed between meals, but they must be fruit or vegetables. Aim to eat over a 12 hour period , example 7 am to 7 pm, and STOP after  your last meal of the day. Drink water,generally about 64 ounces per day, no other drink is as healthy. Fruit juice is best enjoyed in a healthy way, by EATING the  fruit.  Thanks for choosing [facility] we consider it a privelige to serve you.

## 2022-06-09 NOTE — Addendum Note (Signed)
Addended byRaj Janus on: 06/09/2022 03:59 PM   Modules accepted: Orders

## 2022-06-10 LAB — BASIC METABOLIC PANEL
BUN/Creatinine Ratio: 16 (ref 9–20)
BUN: 13 mg/dL (ref 6–24)
CO2: 27 mmol/L (ref 20–29)
Calcium: 9.7 mg/dL (ref 8.7–10.2)
Chloride: 98 mmol/L (ref 96–106)
Creatinine, Ser: 0.83 mg/dL (ref 0.76–1.27)
Glucose: 200 mg/dL — ABNORMAL HIGH (ref 70–99)
Potassium: 4.3 mmol/L (ref 3.5–5.2)
Sodium: 141 mmol/L (ref 134–144)
eGFR: 111 mL/min/{1.73_m2} (ref >59)

## 2022-06-12 NOTE — Telephone Encounter (Signed)
Pt was advised [facility] 

## 2022-06-13 ENCOUNTER — Other Ambulatory Visit: Payer: Self-pay | Admitting: Nurse Practitioner

## 2022-06-13 DIAGNOSIS — F909 Attention-deficit hyperactivity disorder, unspecified type: Secondary | ICD-10-CM

## 2022-06-13 MED ORDER — AMPHETAMINE-DEXTROAMPHETAMINE 30 MG PO TABS: 30.0000 mg | ORAL_TABLET | Freq: Two times a day (BID) | ORAL | 0 refills | Status: DC

## 2022-07-03 ENCOUNTER — Encounter (HOSPITAL_COMMUNITY): Payer: Self-pay | Admitting: Psychiatry

## 2022-07-03 ENCOUNTER — Ambulatory Visit (HOSPITAL_COMMUNITY): Payer: 59 | Admitting: Psychiatry

## 2022-07-03 DIAGNOSIS — F902 Attention-deficit hyperactivity disorder, combined type: Secondary | ICD-10-CM

## 2022-07-03 DIAGNOSIS — F1120 Opioid dependence, uncomplicated: Secondary | ICD-10-CM

## 2022-07-03 NOTE — Progress Notes (Signed)
Psychiatric Initial Adult Assessment   Patient Identification: Jordan Madden MRN:  [id number] Date of Evaluation:  07/03/2022 Referral Source: PCP Chief Complaint:   Chief Complaint  Patient presents with   Establish Care   Visit Diagnosis:    ICD-10-CM   1. Attention deficit hyperactivity disorder (ADHD), combined type  F90.2     2. 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 male with a history of GAD, HTN, DM, and reported ADHD who presents virtually to [facility] at [facility] for initial evaluation on 07/03/2022.  Patient reports a past history of anxiety and depression that has been stable since 2010.  During periods of increased anxiety he endorses symptoms of restlessness, racing thoughts, excessive worry, and fear of something awful happening.  At initial evaluation patient primary concern was his ADHD symptoms.  He discussed struggling with poor concentration/focus, difficulty completing tasks, being easily distracted, and poor memory.  He has improvement on his symptoms with stimulant medication which has been prescribed since he was 16 when he was initially diagnosed.  Patient had not completed neuropsych testing in the past.  Of note patient does have a dx of opiate use disorder which has been on Suboxone maintenance since around 2009.  Patient is expected to have an ADHD diagnosis well but we will refer for neuropsych testing for confirmation.  A number of assessments were performed during the evaluation today including PHQ-9 which they scored a 0 on, GAD-7 which they scored a 0 on, and [place] suicide severity screening which showed no risk.     Plan: - Recommend changing Adderall to 20 mg TID, plan to continue management by PCP until neuropsych testing is completed - Continue Suboxone 8-2 mg film BID, 4-1 mg QHS, managed by Dr. Demetrio Lapping - CMP and A1c reviewed - Neuropsych testing  referral - Crisis resources reviewed - Follow up in a 3 months  History of Present Illness: Jordan Madden presents reporting that he is here to establish with psychiatric provider.  He had been following with a provider in [place] until he moved a couple years ago.  Patient had been connected with the provider through [facility] who left the practice a few months ago.  Patient did not fit well with the new provider and thus reached out for his PCP to connect with someone different.  Patient reports that he was diagnosed with ADHD, mild anxiety, and depressive symptoms at age 54.  Jordan Madden believes this was in part secondary to leaving his friends and heading off to college.  Around that time he was started on stimulant medication and he proceeded to do well in college and graduated with a Loss adjuster, chartered.  During college his ADHD symptoms had been well managed and he was titrated to an Adderall dose of 30 mg 3 times a day.  The anxiety and depression symptoms had been more mild at that time.  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  Patient was prescribed Percocet to manage his pain symptoms and developed a dependence over time.  Eventually reached the point that he was obtaining extra pills from the street.  This went on for a few years before patient connected with a Suboxone clinic.  He has been on Suboxone for 15 years now and reports that his opioid use disorder has been well controlled.  During the period of his opioid  use patient also endorsed increased anxiety.  He noted having racing thoughts, feeling overwhelmed, excessive worry, and restlessness.  This was largely secondary to the opioid use and financial stressors.  He had tried daily anxiety medication such as Zoloft and Effexor however discontinued both after a few months.  Patient reports feeling more lethargic on the medication.  He had also been prescribed Klonopin 1 mg which she took  twice a day for several years.  After 2010 his anxiety symptoms had improved and patient gradually decreased the Klonopin use.  He reports that he was using it as needed intermittently now though has not used it in several months.  Currently patient reports that his anxiety symptoms are well controlled.  He also denies any depression.  His only concern is with his ADHD symptoms.  Patient reports that he has increased restlessness, racing thoughts, difficulty focusing, difficulty completing tasks, and is easily distracted.  This is improved on medications.  Patient had been taking 30 mg 3 times a day in the past though has been decreased to 30 mg twice a day by his PCP.  We discussed his medication regimen and explained that 60 would be the max recommended dose of Adderall and that we would recommend not going higher than that.  It was suggested changing the dosing to 20 mg 3 times a day instead which patient was agreeable to.  We also discussed the need for neuropsych testing for this provider to take over the prescription patient was agreeable to this as well.  We will plan to recheck with PCP to see if she can continue the prescription until he completes neuropsych testing.  In regards to other medication patient does not feel anything else is necessary at this time as his anxiety and depression are well controlled.  He also mentioned that he has considered starting to taper down on the Suboxone.  Associated Signs/Symptoms: Depression Symptoms:  difficulty concentrating, (Hypo) Manic Symptoms:   Denies Anxiety Symptoms:   Denies Psychotic Symptoms:   Denies PTSD Symptoms: NA  Past Psychiatric History: Patient had followed at [facility] in the past. His provider there let and we was not pleased with the care from his new provider. He reports that he has multiple other providers while in [place] starting when he was 16. No prior psychiatric hospitalizations or suicide attempts. He has had  multiple therapists in the past. Has gone to couples counseling in the past. Has not had it in 10 years.  Patient has been prescribed Effexor (only took short term) and Zoloft (felt lethargic) in the past.   Smokes about half pack of cigarettes daily since age 57. Patient endorses marijauna use daily. Denies any other substance use currently. Used cocaine in college. Had an opiate use disorder after college. He had developed an opiate addiction after being prescribed pain medication. Been on suboxone for 15 years now. Denies alcohol use in 15 years.  Previous Psychotropic Medications: Yes   Substance Abuse History in the last 12 months:  No.  Consequences of Substance Abuse: NA  Past Medical History:  Past Medical History:  Diagnosis Date   ADHD    Anxiety    High blood pressure    History reviewed. No pertinent surgical history.  Family Psychiatric History: As below  Family History:  Family History  Problem Relation Age of Onset   Alcoholism Mother    Alcoholism Father    Colon cancer Neg Hx    Cancer - Lung Neg Hx  Social History:   Social History   Socioeconomic History   Marital status: Married    Spouse name: Not on file   Number of children: Not on file   Years of education: Not on file   Highest education level: Master's degree (e.g., MA, MS, MEng, MEd, MSW, MBA)  Occupational History   Not on file  Tobacco Use   Smoking status: Every Day    Packs/day: .5    Types: Cigarettes   Smokeless tobacco: Never  Substance and Sexual Activity   Alcohol use: Not Currently   Drug use: Never   Sexual activity: Yes  Other Topics Concern   Not on file  Social History Narrative   Lives with his spouse    Social Determinants of Health   Financial Resource Strain: Medium Risk (06/08/2022)   Overall Financial Resource Strain (CARDIA)    Difficulty of Paying Living Expenses: Somewhat hard  Food Insecurity: Food Insecurity Present (06/08/2022)   Hunger Vital Sign     Worried About Running Out of Food in the Last Year: Sometimes true    Ran Out of Food in the Last Year: Sometimes true  Transportation Needs: No Transportation Needs (06/08/2022)   PRAPARE - Administrator, Civil Service (Medical): No    Lack of Transportation (Non-Medical): No  Physical Activity: Insufficiently Active (06/08/2022)   Exercise Vital Sign    Days of Exercise per Week: 3 days    Minutes of Exercise per Session: 20 min  Stress: No Stress Concern Present (06/08/2022)   [employer] of Occupational Health - Occupational Stress Questionnaire    Feeling of Stress : Only a little  Social Connections: Moderately Integrated (06/08/2022)   Social Connection and Isolation Panel [NHANES]    Frequency of Communication with Friends and Family: Twice a week    Frequency of Social Gatherings with Friends and Family: Once a week    Attends Religious Services: 1 to 4 times per year    Active Member of [employer] or Organizations: No    Attends Engineer, structural: Not on file    Marital Status: Married    Additional Social History: He moved from [place] 2 years ago with his wife to be closer to his mother.  Previous PCP was at  [facility] in [place].  Patient works as a Corporate investment banker primarily residential homes.  He enjoys playing golf and hockey.  Allergies:   Allergies  Allergen Reactions   Bee Pollen     Metabolic Disorder Labs: Lab Results  Component Value Date   HGBA1C 11.1 (A) 06/09/2022   No results found for: "PROLACTIN" No results found for: "CHOL", "TRIG", "HDL", "CHOLHDL", "VLDL", "LDLCALC" No results found for: "TSH"  Therapeutic Level Labs: No results found for: "LITHIUM" No results found for: "CBMZ" No results found for: "VALPROATE"  Current Medications: Current Outpatient Medications  Medication Sig Dispense Refill   albuterol (VENTOLIN HFA) 108 (90 Base) MCG/ACT inhaler Inhale 1-2 puffs into the lungs every 6  (six) hours as needed for wheezing or shortness of breath. 18 g 0   amLODipine (NORVASC) 5 MG tablet Take 1 tablet (5 mg total) by mouth daily. (Patient not taking: Reported on 06/09/2022) 90 tablet 1   amphetamine-dextroamphetamine (ADDERALL) 30 MG tablet Take 1 tablet by mouth 2 (two) times daily for 20 days. 40 tablet 0   Blood Glucose Monitoring Suppl DEVI 1 each by Does not apply route in the morning, at noon, and at bedtime.  May substitute to any manufacturer covered by patient's insurance. 1 each 0   Blood Pressure Monitoring (BLOOD PRESSURE MONITOR AUTOMAT) DEVI Please check your blood pressure as directed. 1 each 0   Glucose Blood (BLOOD GLUCOSE TEST STRIPS) STRP 1 each by In Vitro route in the morning, at noon, and at bedtime. May substitute to any manufacturer covered by patient's insurance. 90 each 0   Lancet Device MISC 1 each by Does not apply route in the morning, at noon, and at bedtime. May substitute to any manufacturer covered by patient's insurance. 1 each 0   Lancets Misc. MISC 1 each by Does not apply route in the morning, at noon, and at bedtime. May substitute to any manufacturer covered by patient's insurance. 100 each 0   lisinopril (ZESTRIL) 10 MG tablet Take 1 tablet (10 mg total) by mouth daily. 90 tablet 1   metFORMIN (GLUCOPHAGE-XR) 500 MG 24 hr tablet Take 1 tablet (500 mg total) by mouth 2 (two) times daily with a meal. 60 tablet 3   Semaglutide,0.25 or 0.5MG /DOS, 2 MG/3ML SOPN Inject 0.25 mg into the skin once a week. 3 mL 0   SUBOXONE 8-2 MG FILM SMARTSIG:2.5 Strip(s) Sublingual Daily     triamcinolone cream (KENALOG) 0.1 % Apply 1 Application topically 2 (two) times daily. 30 g 0   No current facility-administered medications for this visit.    Psychiatric Specialty Exam: Review of Systems  There were no vitals taken for this visit.There is no height or weight on file to calculate BMI.  General Appearance: Well Groomed and smoking a cigarette during the session   Eye Contact:  Good  Speech:  Clear and Coherent  Volume:  Normal  Mood:  Euthymic  Affect:  Congruent  Thought Process:  Coherent and Goal Directed  Orientation:  Full (Time, Place, and Person)  Thought Content:  Logical  Suicidal Thoughts:  No  Homicidal Thoughts:  No  Memory:  Immediate;   Good  Judgement:  Good  Insight:  Fair  Psychomotor Activity:  Restlessness  Concentration:  Concentration: Fair  Recall:  Fair  Fund of Knowledge:Fair  Language: Good  Akathisia:  NA    AIMS (if indicated):  not done  Assets:  Communication Skills Desire for Improvement Financial Resources/Insurance Housing Intimacy Social Support Transportation Vocational/Educational  ADL's:  Intact  Cognition: WNL  Sleep:  Good   Screenings: GAD-7    Flowsheet Row [facility]  Total GAD-7 Score 0      PHQ2-9    Flowsheet Row Office Visit [facility] 07/03/2022 in [facility] Office Visit from 06/09/2022 in [facility] Office Visit from 05/12/2022 in [facility]  PHQ-2 Total Score 0 0 0  PHQ-9 Total Score -- 5 1      Flowsheet Row Office Visit from 07/03/2022 in [facility] ED from 05/10/2021 in [facility] ED from 12/28/2020 in [facility] at [facility] RISK CATEGORY No Risk No Risk Error: Question 6 not populated        Collaboration of Care: Medication Management AEB provide recommendations, Primary Care Provider AEB chart review, and Other provider involved in patient's care AEB neuropsych testing referral  Patient/Guardian was advised Release of Information must be obtained prior to any record release in order to collaborate their care with an outside provider. Patient/Guardian was advised if they  have not already done so to contact the  registration department to sign all necessary forms in order for [place] to release information regarding their care.   Consent: Patient/Guardian gives verbal consent for treatment and assignment of benefits for services provided during this visit. Patient/Guardian expressed understanding and agreed to proceed.   Stasia Cavalier, MD 6/13/202411:00 AM    Virtual Visit via Video Note  I connected with Lloyd Huger on 07/03/22 at 10:00 AM EDT by a video enabled telemedicine application and verified that I am speaking with the correct person using two identifiers.  Location: Patient: Work Provider: Home office   I discussed the limitations of evaluation and management by telemedicine and the availability of in person appointments. The patient expressed understanding and agreed to proceed.   I discussed the assessment and treatment plan with the patient. The patient was provided an opportunity to ask questions and all were answered. The patient agreed with the plan and demonstrated an understanding of the instructions.   The patient was advised to call back or seek an in-person evaluation if the symptoms worsen or if the condition fails to improve as anticipated.  60 minutes were spent in chart review, interview, psycho education, counseling, medical decision making, coordination of care and long-term prognosis.  Patient was given opportunity to ask question and all concerns and questions were addressed and answers. Excluding separately billable services.  Stasia Cavalier, MD

## 2022-07-07 ENCOUNTER — Ambulatory Visit: Payer: 59 | Admitting: Nurse Practitioner

## 2022-07-11 ENCOUNTER — Ambulatory Visit: Payer: Self-pay | Admitting: Nurse Practitioner

## 2022-07-16 ENCOUNTER — Ambulatory Visit: Payer: 59 | Admitting: Dietician

## 2022-07-21 ENCOUNTER — Ambulatory Visit: Payer: 59 | Admitting: Nurse Practitioner

## 2022-07-21 ENCOUNTER — Encounter: Payer: Self-pay | Admitting: Nurse Practitioner

## 2022-07-21 VITALS — BP 131/83 | HR 85 | Temp 97.6°F | Ht 68.0 in | Wt 258.0 lb

## 2022-07-21 DIAGNOSIS — E1165 Type 2 diabetes mellitus with hyperglycemia: Secondary | ICD-10-CM

## 2022-07-21 DIAGNOSIS — I10 Essential (primary) hypertension: Secondary | ICD-10-CM | POA: Diagnosis not present

## 2022-07-21 DIAGNOSIS — F909 Attention-deficit hyperactivity disorder, unspecified type: Secondary | ICD-10-CM

## 2022-07-21 DIAGNOSIS — Z720 Tobacco use: Secondary | ICD-10-CM | POA: Diagnosis not present

## 2022-07-21 DIAGNOSIS — E669 Obesity, unspecified: Secondary | ICD-10-CM

## 2022-07-21 MED ORDER — LISINOPRIL 10 MG PO TABS: 10.0000 mg | ORAL_TABLET | Freq: Every day | ORAL | 1 refills | Status: DC

## 2022-07-21 MED ORDER — OZEMPIC (0.25 OR 0.5 MG/DOSE) 2 MG/3ML ~~LOC~~ SOPN: 0.5000 mg | PEN_INJECTOR | SUBCUTANEOUS | 0 refills | Status: DC

## 2022-07-21 MED ORDER — AMLODIPINE BESYLATE 5 MG PO TABS: 5.0000 mg | ORAL_TABLET | Freq: Every day | ORAL | 1 refills | Status: DC

## 2022-07-21 MED ORDER — SEMAGLUTIDE (1 MG/DOSE) 4 MG/3ML ~~LOC~~ SOPN: 1.0000 mg | PEN_INJECTOR | SUBCUTANEOUS | 2 refills | Status: DC

## 2022-07-21 NOTE — Assessment & Plan Note (Signed)
Has upcoming appointment with neuropsych Continue Adderall as ordered

## 2022-07-21 NOTE — Assessment & Plan Note (Addendum)
Lab Results  Component Value Date   HGBA1C 11.1 (A) 06/09/2022  Doing well on Ozempic 0.25 mg once weekly Start Ozempic 0.5 mg once weekly take for 4 weeks, after completion start Ozempic 1 mg once weekly injection Start metformin 500 mg twice daily Patient counseled on low-carb modified diet Scheduled for diabetic eye exam Diabetic foot exam completed today Follow-up in 2 months Encouraged to check blood glucose at home and report hypoglycemia Fasting lipid panel ordered

## 2022-07-21 NOTE — Assessment & Plan Note (Signed)
Continues to smoke about half pack of cigarettes daily Smoking cessation encouraged

## 2022-07-21 NOTE — Assessment & Plan Note (Addendum)
Wt Readings from Last 3 Encounters:  07/21/22 258 lb (117 kg)  06/09/22 263 lb (119.3 kg)  05/12/22 273 lb 6.4 oz (124 kg)   Body mass index is 39.23 kg/m.  He has lost about 5 pounds since last visit Patient counseled on low-carb modified diet Encouraged to engage in regular moderate to vigorous exercise at least 150 Minutes weekly

## 2022-07-21 NOTE — Assessment & Plan Note (Signed)
BP Readings from Last 3 Encounters:  07/21/22 131/83  06/09/22 (!) 158/106  05/12/22 (!) 167/100   HTN Controlled .  On amlodipine 5 mg daily, lisinopril 10 mg daily Continue current medications. No changes in management. Discussed DASH diet and dietary sodium restrictions Continue to increase dietary efforts and exercise.  Follow-up in 2 months

## 2022-07-21 NOTE — Progress Notes (Signed)
Established Patient Office Visit  Subjective:  Patient ID: Jordan Madden, male    DOB: [birth year]  Age: 42 y.o. MRN: [id number]  CC:  Chief Complaint  Patient presents with   Follow-up    HPI Jordan Madden is a 42 y.o. male   has a past medical history of ADHD, Anxiety, High blood pressure, and Type 2 diabetes mellitus (HCC).   Patient presents for follow-up for his chronic medical conditions  Hypertension.  Currently on amlodipine 5 mg daily, lisinopril 10 mg daily.  Reports blood pressure readings of less than 120's over 80s at home.  He denies chest pain, dizziness, edema  Uncontrolled type 2 diabetes.  Currently on Ozempic 0.25 mg once weekly injection.  He has not been taking metformin, stated that did not know he was supposed to take the medication patient denies abdominal pain, nausea, vomiting.  He has been avoiding sugars,  cake and tries to stay away from bread.  He also goes to the gym for 30 minutes daily.  ADHD.  Psychiatrist recommended changing Adderall to 20 mg 3 times daily, has upcoming appointment with a psychologist on 7/25.  After diagnosis has been established by neuropsych ,psychiatrist plans to pick up med management    Continues to smoke 0.5ppd of cigarettes daily.  Smoking cessation encouraged.    Past Medical History:  Diagnosis Date   ADHD    Anxiety    High blood pressure    Type 2 diabetes mellitus (HCC)     History reviewed. No pertinent surgical history.  Family History  Problem Relation Age of Onset   Alcoholism Mother    Alcoholism Father    Colon cancer Neg Hx    Cancer - Lung Neg Hx     Social History   Socioeconomic History   Marital status: Married    Spouse name: Not on file   Number of children: Not on file   Years of education: Not on file   Highest education level: Master's degree (e.g., MA, MS, MEng, MEd, MSW, MBA)  Occupational History   Not on file  Tobacco Use   Smoking status: Every Day    Packs/day: .5    Types:  Cigarettes   Smokeless tobacco: Never  Substance and Sexual Activity   Alcohol use: Not Currently   Drug use: Never   Sexual activity: Yes  Other Topics Concern   Not on file  Social History Narrative   Lives with his spouse    Social Determinants of Health   Financial Resource Strain: Medium Risk (06/08/2022)   Overall Financial Resource Strain (CARDIA)    Difficulty of Paying Living Expenses: Somewhat hard  Food Insecurity: Food Insecurity Present (06/08/2022)   Hunger Vital Sign    Worried About Running Out of Food in the Last Year: Sometimes true    Ran Out of Food in the Last Year: Sometimes true  Transportation Needs: No Transportation Needs (06/08/2022)   PRAPARE - Administrator, Civil Service (Medical): No    Lack of Transportation (Non-Medical): No  Physical Activity: Insufficiently Active (06/08/2022)   Exercise Vital Sign    Days of Exercise per Week: 3 days    Minutes of Exercise per Session: 20 min  Stress: No Stress Concern Present (06/08/2022)   [employer] of Occupational Health - Occupational Stress Questionnaire    Feeling of Stress : Only a little  Social Connections: Moderately Integrated (06/08/2022)   Social Connection and Isolation Panel [NHANES]  Frequency of Communication with Friends and Family: Twice a week    Frequency of Social Gatherings with Friends and Family: Once a week    Attends Religious Services: 1 to 4 times per year    Active Member of [employer] or Organizations: No    Attends Engineer, structural: Not on file    Marital Status: Married  Catering manager Violence: Not on file    Outpatient Medications Prior to Visit  Medication Sig Dispense Refill   albuterol (VENTOLIN HFA) 108 (90 Base) MCG/ACT inhaler Inhale 1-2 puffs into the lungs every 6 (six) hours as needed for wheezing or shortness of breath. 18 g 0   amphetamine-dextroamphetamine (ADDERALL) 30 MG tablet Take 1 tablet by mouth 2 (two) times daily for 20  days. 40 tablet 0   Blood Glucose Monitoring Suppl DEVI 1 each by Does not apply route in the morning, at noon, and at bedtime. May substitute to any manufacturer covered by patient's insurance. 1 each 0   Blood Pressure Monitoring (BLOOD PRESSURE MONITOR AUTOMAT) DEVI Please check your blood pressure as directed. 1 each 0   SUBOXONE 8-2 MG FILM SMARTSIG:2.5 Strip(s) Sublingual Daily     triamcinolone cream (KENALOG) 0.1 % Apply 1 Application topically 2 (two) times daily. 30 g 0   amLODipine (NORVASC) 5 MG tablet Take 1 tablet (5 mg total) by mouth daily. 90 tablet 1   lisinopril (ZESTRIL) 10 MG tablet Take 1 tablet (10 mg total) by mouth daily. 90 tablet 1   Semaglutide,0.25 or 0.5MG /DOS, 2 MG/3ML SOPN Inject 0.25 mg into the skin once a week. 3 mL 0   metFORMIN (GLUCOPHAGE-XR) 500 MG 24 hr tablet Take 1 tablet (500 mg total) by mouth 2 (two) times daily with a meal. (Patient not taking: Reported on 07/21/2022) 60 tablet 3   No facility-administered medications prior to visit.    Allergies  Allergen Reactions   Bee Pollen     ROS Review of Systems  Constitutional:  Negative for activity change, appetite change, chills, diaphoresis, fatigue, fever and unexpected weight change.  HENT:  Negative for congestion, dental problem, drooling and ear discharge.   Eyes:  Negative for pain, discharge, redness and itching.  Respiratory:  Negative for apnea, cough, choking, chest tightness, shortness of breath and wheezing.   Cardiovascular: Negative.  Negative for chest pain, palpitations and leg swelling.  Gastrointestinal:  Negative for abdominal distention, abdominal pain, anal bleeding, blood in stool, constipation, diarrhea and vomiting.  Endocrine: Negative for polydipsia, polyphagia and polyuria.  Genitourinary:  Negative for difficulty urinating, flank pain, frequency and genital sores.  Musculoskeletal: Negative.  Negative for arthralgias, back pain, gait problem and joint swelling.  Skin:   Negative for color change, pallor and rash.  Neurological:  Negative for dizziness, facial asymmetry, light-headedness, numbness and headaches.  Psychiatric/Behavioral:  Negative for agitation, behavioral problems, confusion, hallucinations, self-injury, sleep disturbance and suicidal ideas.       Objective:    Physical Exam Vitals and nursing note reviewed.  Constitutional:      General: He is not in acute distress.    Appearance: Normal appearance. He is obese. He is not ill-appearing, toxic-appearing or diaphoretic.  HENT:     Mouth/Throat:     Mouth: Mucous membranes are moist.     Pharynx: Oropharynx is clear. No oropharyngeal exudate or posterior oropharyngeal erythema.  Eyes:     General: No scleral icterus.       Right eye: No discharge.  Left eye: No discharge.     Extraocular Movements: Extraocular movements intact.     Conjunctiva/sclera: Conjunctivae normal.  Cardiovascular:     Rate and Rhythm: Normal rate and regular rhythm.     Pulses: Normal pulses.     Heart sounds: Normal heart sounds. No murmur heard.    No friction rub. No gallop.  Pulmonary:     Effort: Pulmonary effort is normal. No respiratory distress.     Breath sounds: Normal breath sounds. No stridor. No wheezing, rhonchi or rales.  Chest:     Chest wall: No tenderness.  Abdominal:     General: There is no distension.     Palpations: Abdomen is soft.     Tenderness: There is no abdominal tenderness. There is no right CVA tenderness, left CVA tenderness or guarding.  Musculoskeletal:        General: No swelling, tenderness, deformity or signs of injury.     Right lower leg: No edema.     Left lower leg: No edema.  Skin:    General: Skin is warm and dry.     Capillary Refill: Capillary refill takes less than 2 seconds.     Coloration: Skin is not jaundiced or pale.     Findings: No bruising, erythema or lesion.  Neurological:     Mental Status: He is alert and oriented to person, place,  and time.     Motor: No weakness.     Coordination: Coordination normal.     Gait: Gait normal.  Psychiatric:        Mood and Affect: Mood normal.        Behavior: Behavior normal.        Thought Content: Thought content normal.        Judgment: Judgment normal.     BP 131/83   Pulse 85   Temp 97.6 F (36.4 C)   Ht 5\' 8"  (1.727 m)   Wt 258 lb (117 kg)   SpO2 100%   BMI 39.23 kg/m  Wt Readings from Last 3 Encounters:  07/21/22 258 lb (117 kg)  06/09/22 263 lb (119.3 kg)  05/12/22 273 lb 6.4 oz (124 kg)    No results found for: "TSH" No results found for: "WBC", "HGB", "HCT", "MCV", "PLT" Lab Results  Component Value Date   NA 141 06/09/2022   K 4.3 06/09/2022   CO2 27 06/09/2022   GLUCOSE 200 (H) 06/09/2022   BUN 13 06/09/2022   CREATININE 0.83 06/09/2022   BILITOT 0.5 05/12/2022   ALKPHOS 136 (H) 05/12/2022   AST 25 05/12/2022   ALT 34 05/12/2022   PROT 6.9 05/12/2022   ALBUMIN 4.0 (L) 05/12/2022   CALCIUM 9.7 06/09/2022   EGFR 111 06/09/2022   No results found for: "CHOL" No results found for: "HDL" No results found for: "LDLCALC" No results found for: "TRIG" No results found for: "CHOLHDL" Lab Results  Component Value Date   HGBA1C 11.1 (A) 06/09/2022      Assessment & Plan:   Problem List Items Addressed This Visit       Cardiovascular and Mediastinum   High blood pressure    BP Readings from Last 3 Encounters:  07/21/22 131/83  06/09/22 (!) 158/106  05/12/22 (!) 167/100   HTN Controlled .  On amlodipine 5 mg daily, lisinopril 10 mg daily Continue current medications. No changes in management. Discussed DASH diet and dietary sodium restrictions Continue to increase dietary efforts and exercise.  Follow-up in 2  months        Relevant Medications   lisinopril (ZESTRIL) 10 MG tablet   amLODipine (NORVASC) 5 MG tablet     Endocrine   Uncontrolled type 2 diabetes mellitus with hyperglycemia (HCC) - Primary    Lab Results  Component  Value Date   HGBA1C 11.1 (A) 06/09/2022  Doing well on Ozempic 0.25 mg once weekly Start Ozempic 0.5 mg once weekly take for 4 weeks, after completion start Ozempic 1 mg once weekly injection Start metformin 500 mg twice daily Patient counseled on low-carb modified diet Scheduled for diabetic eye exam Diabetic foot exam completed today Follow-up in 2 months Encouraged to check blood glucose at home and report hypoglycemia Fasting lipid panel ordered      Relevant Medications   Semaglutide,0.25 or 0.5MG /DOS, (OZEMPIC, 0.25 OR 0.5 MG/DOSE,) 2 MG/3ML SOPN (Start on 07/25/2022)   Semaglutide, 1 MG/DOSE, 4 MG/3ML SOPN (Start on 08/22/2022)   lisinopril (ZESTRIL) 10 MG tablet   Other Relevant Orders   Lipid panel   Microalbumin / creatinine urine ratio     Other   Attention deficit hyperactivity disorder (ADHD)    Has upcoming appointment with neuropsych Continue Adderall as ordered       Obesity (BMI 30-39.9)    Wt Readings from Last 3 Encounters:  07/21/22 258 lb (117 kg)  06/09/22 263 lb (119.3 kg)  05/12/22 273 lb 6.4 oz (124 kg)   Body mass index is 39.23 kg/m.  He has lost about 5 pounds since last visit Patient counseled on low-carb modified diet Encouraged to engage in regular moderate to vigorous exercise at least 150 Minutes weekly      Relevant Medications   Semaglutide,0.25 or 0.5MG /DOS, (OZEMPIC, 0.25 OR 0.5 MG/DOSE,) 2 MG/3ML SOPN (Start on 07/25/2022)   Semaglutide, 1 MG/DOSE, 4 MG/3ML SOPN (Start on 08/22/2022)   Tobacco abuse    Continues to smoke about half pack of cigarettes daily Smoking cessation encouraged       Meds ordered this encounter  Medications   Semaglutide,0.25 or 0.5MG /DOS, (OZEMPIC, 0.25 OR 0.5 MG/DOSE,) 2 MG/3ML SOPN    Sig: Inject 0.5 mg into the skin once a week for 24 days.    Dispense:  3 mL    Refill:  0   Semaglutide, 1 MG/DOSE, 4 MG/3ML SOPN    Sig: Inject 1 mg as directed once a week.    Dispense:  3 mL    Refill:  2    lisinopril (ZESTRIL) 10 MG tablet    Sig: Take 1 tablet (10 mg total) by mouth daily.    Dispense:  90 tablet    Refill:  1   amLODipine (NORVASC) 5 MG tablet    Sig: Take 1 tablet (5 mg total) by mouth daily.    Dispense:  90 tablet    Refill:  1    Follow-up: Return in about 2 months (around 09/21/2022) for CPE, FASTING LABS THIS WEEK.    Donell Beers, FNP

## 2022-07-21 NOTE — Patient Instructions (Signed)
  Goal for fasting blood sugar ranges from 80 to 120 and 2 hours after any meal or at bedtime should be between 130 to 170.     Uncontrolled type 2 diabetes mellitus with hyperglycemia (HCC)  - Semaglutide,0.25 or 0.5MG /DOS, (OZEMPIC, 0.25 OR 0.5 MG/DOSE,) 2 MG/3ML SOPN; Inject 0.5 mg into the skin once a week for 24 days.  Dispense: 3 mL; Refill: 0 - Semaglutide, 1 MG/DOSE, 4 MG/3ML SOPN; Inject 1 mg as directed once a week.  Dispense: 3 mL; Refill: 2 - Lipid panel; Future    . Hypertension, unspecified type - lisinopril (ZESTRIL) 10 MG tablet; Take 1 tablet (10 mg total) by mouth daily.  Dispense: 90 tablet; Refill: 1 - amLODipine (NORVASC) 5 MG tablet; Take 1 tablet (5 mg total) by mouth daily.  Dispense: 90 tablet; Refill: 1     It is important that you exercise regularly at least 30 minutes 5 times a week as tolerated  Think about what you will eat, plan ahead. Choose " clean, green, fresh or frozen" over canned, processed or packaged foods which are more sugary, salty and fatty. 70 to 75% of food eaten should be vegetables and fruit. Three meals at set times with snacks allowed between meals, but they must be fruit or vegetables. Aim to eat over a 12 hour period , example 7 am to 7 pm, and STOP after  your last meal of the day. Drink water,generally about 64 ounces per day, no other drink is as healthy. Fruit juice is best enjoyed in a healthy way, by EATING the fruit.  Thanks for choosing [facility] we consider it a privelige to serve you.

## 2022-07-22 LAB — MICROALBUMIN / CREATININE URINE RATIO
Creatinine, Urine: 289.6 mg/dL
Microalb/Creat Ratio: 182 mg/g creat — ABNORMAL HIGH (ref 0–29)
Microalbumin, Urine: 527.4 ug/mL

## 2022-07-28 ENCOUNTER — Other Ambulatory Visit: Payer: Self-pay

## 2022-07-29 ENCOUNTER — Other Ambulatory Visit: Payer: Self-pay | Admitting: Family Medicine

## 2022-07-29 DIAGNOSIS — F909 Attention-deficit hyperactivity disorder, unspecified type: Secondary | ICD-10-CM

## 2022-07-29 MED ORDER — AMPHETAMINE-DEXTROAMPHETAMINE 30 MG PO TABS: 30.0000 mg | ORAL_TABLET | Freq: Two times a day (BID) | ORAL | 0 refills | Status: DC

## 2022-07-29 NOTE — Telephone Encounter (Signed)
Reviewed PDMP substance reporting system prior to prescribing medication.  Meds ordered this encounter  Medications   amphetamine-dextroamphetamine (ADDERALL) 30 MG tablet    Sig: Take 1 tablet by mouth 2 (two) times daily for 20 days.    Dispense:  40 tablet    Refill:  0    Order Specific Question:   Supervising Provider    Answer:   Quentin Angst [[id number]]   Nolon Nations  APRN, MSN, FNP-C [facility] [address] [place], [place] [address] [phone]

## 2022-07-29 NOTE — Telephone Encounter (Signed)
From: Lloyd Huger To: Office of Donell Beers, [place] Sent: 07/29/2022 10:40 AM EDT Subject: Medication Renewal Request  Refills have been requested for the following medications:   amphetamine-dextroamphetamine (ADDERALL) 30 MG tablet [[facility]]  Patient Comment: May I have a thirty day supply, and dose changed TO 20mg  TID. Last filled the 13 th, took first dose. On that day  Preferred pharmacy: [id number] - [place], [place] - [address] AT [facility] OF [facility] & [address] Delivery method: Daryll Drown

## 2022-08-13 ENCOUNTER — Other Ambulatory Visit: Payer: Self-pay | Admitting: Nurse Practitioner

## 2022-08-13 DIAGNOSIS — F909 Attention-deficit hyperactivity disorder, unspecified type: Secondary | ICD-10-CM

## 2022-08-17 ENCOUNTER — Other Ambulatory Visit: Payer: Self-pay | Admitting: Nurse Practitioner

## 2022-08-17 DIAGNOSIS — F909 Attention-deficit hyperactivity disorder, unspecified type: Secondary | ICD-10-CM

## 2022-08-17 MED ORDER — AMPHETAMINE-DEXTROAMPHETAMINE 20 MG PO TABS: 20.0000 mg | ORAL_TABLET | Freq: Three times a day (TID) | ORAL | 0 refills | Status: DC

## 2022-08-17 NOTE — Progress Notes (Signed)
PDMP reviewed . Adderall due for refill  on 08/20/2022. I have refill adderall 20mg  TID as recommended by Dr Ledora Bottcher.

## 2022-08-18 ENCOUNTER — Other Ambulatory Visit: Payer: Self-pay | Admitting: Nurse Practitioner

## 2022-08-18 DIAGNOSIS — F909 Attention-deficit hyperactivity disorder, unspecified type: Secondary | ICD-10-CM

## 2022-08-19 ENCOUNTER — Ambulatory Visit (INDEPENDENT_AMBULATORY_CARE_PROVIDER_SITE_OTHER): Payer: 59 | Admitting: Nurse Practitioner

## 2022-08-19 ENCOUNTER — Other Ambulatory Visit: Payer: Self-pay | Admitting: Nurse Practitioner

## 2022-08-19 DIAGNOSIS — F909 Attention-deficit hyperactivity disorder, unspecified type: Secondary | ICD-10-CM

## 2022-08-19 DIAGNOSIS — E1165 Type 2 diabetes mellitus with hyperglycemia: Secondary | ICD-10-CM

## 2022-08-19 MED ORDER — AMPHETAMINE-DEXTROAMPHETAMINE 30 MG PO TABS: 30.0000 mg | ORAL_TABLET | Freq: Two times a day (BID) | ORAL | 0 refills | Status: DC

## 2022-08-19 NOTE — Progress Notes (Signed)
Patient will return for fasting lipid panel.

## 2022-08-20 ENCOUNTER — Other Ambulatory Visit: Payer: 59

## 2022-08-20 DIAGNOSIS — E1165 Type 2 diabetes mellitus with hyperglycemia: Secondary | ICD-10-CM

## 2022-08-20 NOTE — Telephone Encounter (Signed)
Pt was advised Kh 

## 2022-08-22 ENCOUNTER — Other Ambulatory Visit: Payer: Self-pay | Admitting: Nurse Practitioner

## 2022-08-22 DIAGNOSIS — E785 Hyperlipidemia, unspecified: Secondary | ICD-10-CM

## 2022-08-22 MED ORDER — ATORVASTATIN CALCIUM 10 MG PO TABS: 10.0000 mg | ORAL_TABLET | Freq: Every day | ORAL | 1 refills | Status: DC

## 2022-09-16 ENCOUNTER — Other Ambulatory Visit: Payer: Self-pay | Admitting: Nurse Practitioner

## 2022-09-16 DIAGNOSIS — F909 Attention-deficit hyperactivity disorder, unspecified type: Secondary | ICD-10-CM

## 2022-09-16 MED ORDER — AMPHETAMINE-DEXTROAMPHETAMINE 30 MG PO TABS: 30.0000 mg | ORAL_TABLET | Freq: Two times a day (BID) | ORAL | 0 refills | Status: DC

## 2022-09-23 ENCOUNTER — Ambulatory Visit: Payer: Self-pay | Admitting: Nurse Practitioner

## 2022-10-13 ENCOUNTER — Other Ambulatory Visit: Payer: Self-pay | Admitting: Nurse Practitioner

## 2022-10-13 DIAGNOSIS — F909 Attention-deficit hyperactivity disorder, unspecified type: Secondary | ICD-10-CM

## 2022-10-15 ENCOUNTER — Telehealth: Payer: Self-pay

## 2022-10-15 NOTE — Telephone Encounter (Signed)
Pt was called to advised that Jordan Madden will not fill his adderall before it is due. Pt will also need to follow up with phsy. To continue on the medication. KH

## 2022-10-17 ENCOUNTER — Encounter: Payer: Self-pay | Admitting: Nurse Practitioner

## 2022-10-17 ENCOUNTER — Ambulatory Visit (INDEPENDENT_AMBULATORY_CARE_PROVIDER_SITE_OTHER): Payer: 59 | Admitting: Nurse Practitioner

## 2022-10-17 VITALS — BP 127/83 | HR 101 | Temp 97.4°F | Wt 252.0 lb

## 2022-10-17 DIAGNOSIS — Z720 Tobacco use: Secondary | ICD-10-CM

## 2022-10-17 DIAGNOSIS — E785 Hyperlipidemia, unspecified: Secondary | ICD-10-CM | POA: Diagnosis not present

## 2022-10-17 DIAGNOSIS — E669 Obesity, unspecified: Secondary | ICD-10-CM | POA: Diagnosis not present

## 2022-10-17 DIAGNOSIS — E1165 Type 2 diabetes mellitus with hyperglycemia: Secondary | ICD-10-CM | POA: Diagnosis not present

## 2022-10-17 DIAGNOSIS — F909 Attention-deficit hyperactivity disorder, unspecified type: Secondary | ICD-10-CM

## 2022-10-17 DIAGNOSIS — I10 Essential (primary) hypertension: Secondary | ICD-10-CM

## 2022-10-17 HISTORY — DX: Hyperlipidemia, unspecified: E78.5

## 2022-10-17 LAB — POCT GLYCOSYLATED HEMOGLOBIN (HGB A1C)
HbA1c POC (<> result, manual entry): 6.5 % (ref 4.0–5.6)
HbA1c, POC (controlled diabetic range): 6.5 % (ref 0.0–7.0)
Hemoglobin A1C: 6.5 % — AB (ref 4.0–5.6)

## 2022-10-17 MED ORDER — AMPHETAMINE-DEXTROAMPHETAMINE 30 MG PO TABS: 30.0000 mg | ORAL_TABLET | Freq: Two times a day (BID) | ORAL | 0 refills | Status: DC

## 2022-10-17 NOTE — Patient Instructions (Addendum)
[email or web address] [facility]  [address] [place], [place], [place] [address]  [phone]  Goal for fasting blood sugar ranges from 80 to 120 and 2 hours after any meal or at bedtime should be between 130 to 170.   Carry rapidly absorbed carbohydrate source at all times Treats low glucose less than 70 as per rule of 15's.  Give 15 g of rapidly absorbed carbohydrate i.e. half cup juice or 4 glucose tabs.  Recheck glucose level in 15 minutes.  Give another 15 g of carbohydrate if glucose still less than 70.  Repeat until glucose level is greater than 70.  Once glucose level returns to normal consider follow-up with a snack or meal.  Informed provider of hypoglycemia episode at next appointment.      1. controlled type 2 diabetes mellitus with hyperglycemia (HCC)  - POCT glycosylated hemoglobin (Hb A1C) - Lipid panel - LDL Cholesterol, Direct - Ambulatory referral to Ophthalmology  2. Dyslipidemia, goal LDL below 70  - Lipid panel - LDL Cholesterol, Direct  3. Obesity (BMI 30-39.9)   4. Tobacco abuse   5. Attention deficit hyperactivity disorder (ADHD), unspecified ADHD type  - amphetamine-dextroamphetamine (ADDERALL) 30 MG tablet; Take 1 tablet by mouth 2 (two) times daily.  Dispense: 60 tablet; Refill: 0     It is important that you exercise regularly at least 30 minutes 5 times a week as tolerated  Think about what you will eat, plan ahead. Choose " clean, green, fresh or frozen" over canned, processed or packaged foods which are more sugary, salty and fatty. 70 to 75% of food eaten should be vegetables and fruit. Three meals at set times with snacks allowed between meals, but they must be fruit or vegetables. Aim to eat over a 12 hour period , example 7 am to 7 pm, and STOP after  your last meal of the day. Drink water,generally about 64 ounces per day, no other drink is as healthy. Fruit juice is best enjoyed in a healthy way, by EATING the fruit.  Thanks  for choosing [facility] we consider it a privelige to serve you.

## 2022-10-17 NOTE — Progress Notes (Signed)
Established Patient Office Visit  Subjective:  Patient ID: Jordan Madden, male    DOB: Aug 11, 1980  Age: 42 y.o. MRN: [id number]  CC:  Chief Complaint  Patient presents with   Diabetes    Follow up    HPI Jordan Madden is a 42 y.o. male  has a past medical history of ADHD, Anxiety, Dyslipidemia, goal LDL below 70 (10/17/2022), High blood pressure, and Type 2 diabetes mellitus (HCC).  Patient presents for follow-up for his chronic medical conditions Please see assessment and plan section for full HPI     Past Medical History:  Diagnosis Date   ADHD    Anxiety    Dyslipidemia, goal LDL below 70 10/17/2022   High blood pressure    Type 2 diabetes mellitus (HCC)     History reviewed. No pertinent surgical history.  Family History  Problem Relation Age of Onset   Alcoholism Mother    Alcoholism Father    Colon cancer Neg Hx    Cancer - Lung Neg Hx     Social History   Socioeconomic History   Marital status: Married    Spouse name: Not on file   Number of children: Not on file   Years of education: Not on file   Highest education level: Master's degree (e.g., MA, MS, MEng, MEd, MSW, MBA)  Occupational History   Not on file  Tobacco Use   Smoking status: Every Day    Current packs/day: 0.50    Types: Cigarettes   Smokeless tobacco: Never  Substance and Sexual Activity   Alcohol use: Not Currently   Drug use: Never   Sexual activity: Yes  Other Topics Concern   Not on file  Social History Narrative   Lives with his spouse    Social Determinants of Health   Financial Resource Strain: Medium Risk (06/08/2022)   Overall Financial Resource Strain (CARDIA)    Difficulty of Paying Living Expenses: Somewhat hard  Food Insecurity: Food Insecurity Present (06/08/2022)   Hunger Vital Sign    Worried About Running Out of Food in the Last Year: Sometimes true    Ran Out of Food in the Last Year: Sometimes true  Transportation Needs: No Transportation Needs (06/08/2022)    PRAPARE - Administrator, Civil Service (Medical): No    Lack of Transportation (Non-Medical): No  Physical Activity: Insufficiently Active (06/08/2022)   Exercise Vital Sign    Days of Exercise per Week: 3 days    Minutes of Exercise per Session: 20 min  Stress: No Stress Concern Present (06/08/2022)   [employer] of Occupational Health - Occupational Stress Questionnaire    Feeling of Stress : Only a little  Social Connections: Moderately Integrated (06/08/2022)   Social Connection and Isolation Panel [NHANES]    Frequency of Communication with Friends and Family: Twice a week    Frequency of Social Gatherings with Friends and Family: Once a week    Attends Religious Services: 1 to 4 times per year    Active Member of [employer] or Organizations: No    Attends Engineer, structural: Not on file    Marital Status: Married  Catering manager Violence: Not on file    Outpatient Medications Prior to Visit  Medication Sig Dispense Refill   albuterol (VENTOLIN HFA) 108 (90 Base) MCG/ACT inhaler Inhale 1-2 puffs into the lungs every 6 (six) hours as needed for wheezing or shortness of breath. 18 g 0   amLODipine (NORVASC) 5 MG  tablet Take 1 tablet (5 mg total) by mouth daily. 90 tablet 1   atorvastatin (LIPITOR) 10 MG tablet Take 1 tablet (10 mg total) by mouth daily. 90 tablet 1   Blood Glucose Monitoring Suppl DEVI 1 each by Does not apply route in the morning, at noon, and at bedtime. May substitute to any manufacturer covered by patient's insurance. 1 each 0   Blood Pressure Monitoring (BLOOD PRESSURE MONITOR AUTOMAT) DEVI Please check your blood pressure as directed. 1 each 0   lisinopril (ZESTRIL) 10 MG tablet Take 1 tablet (10 mg total) by mouth daily. 90 tablet 1   Semaglutide, 1 MG/DOSE, 4 MG/3ML SOPN Inject 1 mg as directed once a week. 3 mL 2   SUBOXONE 8-2 MG FILM SMARTSIG:2.5 Strip(s) Sublingual Daily     triamcinolone cream (KENALOG) 0.1 % Apply 1  Application topically 2 (two) times daily. 30 g 0   amphetamine-dextroamphetamine (ADDERALL) 30 MG tablet Take 1 tablet by mouth 2 (two) times daily. 60 tablet 0   metFORMIN (GLUCOPHAGE-XR) 500 MG 24 hr tablet Take 1 tablet (500 mg total) by mouth 2 (two) times daily with a meal. (Patient not taking: Reported on 07/21/2022) 60 tablet 3   No facility-administered medications prior to visit.    Allergies  Allergen Reactions   Bee Pollen     ROS Review of Systems  Constitutional:  Negative for activity change, appetite change, chills, diaphoresis, fatigue, fever and unexpected weight change.  HENT:  Negative for congestion, dental problem, drooling and ear discharge.   Eyes:  Negative for pain, discharge, redness and itching.  Respiratory:  Negative for apnea, cough, choking, chest tightness, shortness of breath and wheezing.   Cardiovascular: Negative.  Negative for chest pain, palpitations and leg swelling.  Gastrointestinal:  Negative for abdominal distention, abdominal pain, anal bleeding, blood in stool, constipation, diarrhea and vomiting.  Endocrine: Negative for polydipsia, polyphagia and polyuria.  Genitourinary:  Negative for difficulty urinating, flank pain, frequency and genital sores.  Musculoskeletal: Negative.  Negative for arthralgias, back pain, gait problem and joint swelling.  Skin:  Negative for color change, pallor and rash.  Neurological:  Negative for dizziness, facial asymmetry, light-headedness, numbness and headaches.  Psychiatric/Behavioral:  Negative for agitation, behavioral problems, confusion, hallucinations, self-injury, sleep disturbance and suicidal ideas.       Objective:    Physical Exam Vitals and nursing note reviewed.  Constitutional:      General: He is not in acute distress.    Appearance: Normal appearance. He is obese. He is not ill-appearing, toxic-appearing or diaphoretic.  HENT:     Mouth/Throat:     Mouth: Mucous membranes are moist.      Pharynx: Oropharynx is clear. No oropharyngeal exudate or posterior oropharyngeal erythema.  Eyes:     General: No scleral icterus.       Right eye: No discharge.        Left eye: No discharge.     Extraocular Movements: Extraocular movements intact.     Conjunctiva/sclera: Conjunctivae normal.  Cardiovascular:     Rate and Rhythm: Normal rate and regular rhythm.     Pulses: Normal pulses.     Heart sounds: Normal heart sounds. No murmur heard.    No friction rub. No gallop.  Pulmonary:     Effort: Pulmonary effort is normal. No respiratory distress.     Breath sounds: Normal breath sounds. No stridor. No wheezing, rhonchi or rales.  Chest:     Chest wall: No tenderness.  Abdominal:     General: There is no distension.     Palpations: Abdomen is soft.     Tenderness: There is no abdominal tenderness. There is no right CVA tenderness, left CVA tenderness or guarding.  Musculoskeletal:        General: No swelling, tenderness, deformity or signs of injury.     Right lower leg: No edema.     Left lower leg: No edema.  Skin:    General: Skin is warm and dry.     Capillary Refill: Capillary refill takes less than 2 seconds.     Coloration: Skin is not jaundiced or pale.     Findings: No bruising, erythema or lesion.  Neurological:     Mental Status: He is alert and oriented to person, place, and time.     Motor: No weakness.     Coordination: Coordination normal.     Gait: Gait normal.  Psychiatric:        Mood and Affect: Mood normal.        Behavior: Behavior normal.        Thought Content: Thought content normal.        Judgment: Judgment normal.     BP 127/83   Pulse (!) 101   Temp (!) 97.4 F (36.3 C)   Wt 252 lb (114.3 kg)   SpO2 100%   BMI 38.32 kg/m  Wt Readings from Last 3 Encounters:  10/17/22 252 lb (114.3 kg)  07/21/22 258 lb (117 kg)  06/09/22 263 lb (119.3 kg)    No results found for: "TSH" No results found for: "WBC", "HGB", "HCT", "MCV", "PLT" Lab  Results  Component Value Date   NA 141 06/09/2022   K 4.3 06/09/2022   CO2 27 06/09/2022   GLUCOSE 200 (H) 06/09/2022   BUN 13 06/09/2022   CREATININE 0.83 06/09/2022   BILITOT 0.5 05/12/2022   ALKPHOS 136 (H) 05/12/2022   AST 25 05/12/2022   ALT 34 05/12/2022   PROT 6.9 05/12/2022   ALBUMIN 4.0 (L) 05/12/2022   CALCIUM 9.7 06/09/2022   EGFR 111 06/09/2022   Lab Results  Component Value Date   CHOL 178 08/20/2022   Lab Results  Component Value Date   HDL 35 (L) 08/20/2022   Lab Results  Component Value Date   LDLCALC 117 (H) 08/20/2022   Lab Results  Component Value Date   TRIG 145 08/20/2022   Lab Results  Component Value Date   CHOLHDL 5.1 (H) 08/20/2022   Lab Results  Component Value Date   HGBA1C 11.1 (A) 06/09/2022      Assessment & Plan:   Problem List Items Addressed This Visit       Cardiovascular and Mediastinum   High blood pressure    BP Readings from Last 3 Encounters:  10/17/22 127/83  07/21/22 131/83  06/09/22 (!) 158/106   HTN Controlled .  On amlodipine 5 mg daily, lisinopril 10 mg daily blood pressure readings at home has been below 120/80.  Denies shortness of breath chest pain edema Continue current medications. No changes in management. Discussed DASH diet and dietary sodium restrictions Continue to increase dietary efforts and exercise.           Endocrine   Controlled type 2 diabetes mellitus with hyperglycemia (HCC) - Primary     Lab Results  Component Value Date   HGBA1C 11.1 (A) 06/09/2022  A1c 6.5 today, currently on Ozempic 1 mg once weekly injection, has not been  taking metformin Patient congratulated on his efforts at getting his diabetes under control Stated that he has been following a low-carb modified diet at home and doing some exercise. He has not been checking his blood sugar, signs of hypoglycemia reviewed.  Patient encouraged to check his blood sugar if he experiences any of the symptoms discussed. Patient  referred for diabetic eye exam Continue low-carb modified diet, engage in regular moderate exercises at least 150 minutes weekly Follow-up in 4 months      Relevant Orders   POCT glycosylated hemoglobin (Hb A1C)   Lipid panel   LDL Cholesterol, Direct   Ambulatory referral to Ophthalmology     Other   Attention deficit hyperactivity disorder (ADHD)    Patient was referred to [place] [facility] by the psychiatrist for ADHD testing .  Patient stated that he has not followed up with a psychologist.  He was encouraged to call the office and schedule an appointment for testing.  This is needed in order for the psychiatrist to manage his Adderall. Patient promised calling the office to schedule an appointment as soon as possible  - amphetamine-dextroamphetamine (ADDERALL) 30 MG tablet; Take 1 tablet by mouth 2 (two) times daily.  Dispense: 60 tablet; Refill: 0         Relevant Medications   amphetamine-dextroamphetamine (ADDERALL) 30 MG tablet (Start on 10/19/2022)   Obesity (BMI 30-39.9)    Wt Readings from Last 3 Encounters:  10/17/22 252 lb (114.3 kg)  07/21/22 258 lb (117 kg)  06/09/22 263 lb (119.3 kg)   Body mass index is 38.32 kg/m.  Patient has lost about 6 pounds since last visit Continue low-carb modified diet Encouraged regular regular moderate exercises at least 150 minutes weekly as tolerated      Relevant Medications   amphetamine-dextroamphetamine (ADDERALL) 30 MG tablet (Start on 10/19/2022)   Tobacco abuse    Smokes about 0.5 pack/day  Asked about quitting: confirms that he/she currently smokes cigarettes Advise to quit smoking: Educated about QUITTING to reduce the risk of cancer, cardio and cerebrovascular disease. Assess willingness: Unwilling to quit at this time, but is working on cutting back. Assist with counseling and pharmacotherapy: Counseled for 5 minutes and literature provided. Arrange for follow up: follow up in 3 months and continue to  offer help.       Dyslipidemia, goal LDL below 70    Lab Results  Component Value Date   CHOL 178 08/20/2022   HDL 35 (L) 08/20/2022   LDLCALC 117 (H) 08/20/2022   TRIG 145 08/20/2022   CHOLHDL 5.1 (H) 08/20/2022  Currently on atorvastatin 10 mg daily Checking labs today Avoid fatty fried foods      Relevant Orders   Lipid panel   LDL Cholesterol, Direct    Meds ordered this encounter  Medications   amphetamine-dextroamphetamine (ADDERALL) 30 MG tablet    Sig: Take 1 tablet by mouth 2 (two) times daily.    Dispense:  60 tablet    Refill:  0    Follow-up: Return in about 4 months (around 02/16/2023) for CPE.    Donell Beers, FNP

## 2022-10-17 NOTE — Assessment & Plan Note (Addendum)
Wt Readings from Last 3 Encounters:  10/17/22 252 lb (114.3 kg)  07/21/22 258 lb (117 kg)  06/09/22 263 lb (119.3 kg)   Body mass index is 38.32 kg/m.  Patient has lost about 6 pounds since last visit Continue low-carb modified diet Encouraged regular regular moderate exercises at least 150 minutes weekly as tolerated

## 2022-10-17 NOTE — Assessment & Plan Note (Addendum)
  Lab Results  Component Value Date   HGBA1C 11.1 (A) 06/09/2022  A1c 6.5 today, currently on Ozempic 1 mg once weekly injection, has not been taking metformin Patient congratulated on his efforts at getting his diabetes under control Stated that he has been following a low-carb modified diet at home and doing some exercise. He has not been checking his blood sugar, signs of hypoglycemia reviewed.  Patient encouraged to check his blood sugar if he experiences any of the symptoms discussed. Patient referred for diabetic eye exam Continue low-carb modified diet, engage in regular moderate exercises at least 150 minutes weekly Follow-up in 4 months

## 2022-10-17 NOTE — Assessment & Plan Note (Signed)
BP Readings from Last 3 Encounters:  10/17/22 127/83  07/21/22 131/83  06/09/22 (!) 158/106   HTN Controlled .  On amlodipine 5 mg daily, lisinopril 10 mg daily blood pressure readings at home has been below 120/80.  Denies shortness of breath chest pain edema Continue current medications. No changes in management. Discussed DASH diet and dietary sodium restrictions Continue to increase dietary efforts and exercise.

## 2022-10-17 NOTE — Assessment & Plan Note (Signed)
Lab Results  Component Value Date   CHOL 178 08/20/2022   HDL 35 (L) 08/20/2022   LDLCALC 117 (H) 08/20/2022   TRIG 145 08/20/2022   CHOLHDL 5.1 (H) 08/20/2022  Currently on atorvastatin 10 mg daily Checking labs today Avoid fatty fried foods

## 2022-10-17 NOTE — Assessment & Plan Note (Signed)
Patient was referred to [place] [facility] by the psychiatrist for ADHD testing .  Patient stated that he has not followed up with a psychologist.  He was encouraged to call the office and schedule an appointment for testing.  This is needed in order for the psychiatrist to manage his Adderall. Patient promised calling the office to schedule an appointment as soon as possible  - amphetamine-dextroamphetamine (ADDERALL) 30 MG tablet; Take 1 tablet by mouth 2 (two) times daily.  Dispense: 60 tablet; Refill: 0

## 2022-10-17 NOTE — Assessment & Plan Note (Signed)
Smokes about 0.5 pack/day  Asked about quitting: confirms that he/she currently smokes cigarettes Advise to quit smoking: Educated about QUITTING to reduce the risk of cancer, cardio and cerebrovascular disease. Assess willingness: Unwilling to quit at this time, but is working on cutting back. Assist with counseling and pharmacotherapy: Counseled for 5 minutes and literature provided. Arrange for follow up: follow up in 3 months and continue to offer help. 

## 2022-10-18 LAB — LIPID PANEL
Chol/HDL Ratio: 4.3 {ratio} (ref 0.0–5.0)
Cholesterol, Total: 132 mg/dL (ref 100–199)
HDL: 31 mg/dL — ABNORMAL LOW (ref >39)
LDL Chol Calc (NIH): 76 mg/dL (ref 0–99)
Triglycerides: 142 mg/dL (ref 0–149)
VLDL Cholesterol Cal: 25 mg/dL (ref 5–40)

## 2022-10-18 LAB — LDL CHOLESTEROL, DIRECT: LDL Direct: 79 mg/dL (ref 0–99)

## 2022-10-20 ENCOUNTER — Other Ambulatory Visit: Payer: Self-pay | Admitting: Nurse Practitioner

## 2022-10-20 DIAGNOSIS — E785 Hyperlipidemia, unspecified: Secondary | ICD-10-CM

## 2022-10-20 MED ORDER — ATORVASTATIN CALCIUM 20 MG PO TABS
20.0000 mg | ORAL_TABLET | Freq: Every day | ORAL | 1 refills | Status: AC
Start: 2022-10-20 — End: 2023-10-20

## 2022-11-17 ENCOUNTER — Other Ambulatory Visit: Payer: Self-pay | Admitting: Nurse Practitioner

## 2022-11-17 ENCOUNTER — Encounter (HOSPITAL_COMMUNITY): Payer: Self-pay

## 2022-11-17 DIAGNOSIS — F909 Attention-deficit hyperactivity disorder, unspecified type: Secondary | ICD-10-CM

## 2022-11-17 MED ORDER — AMPHETAMINE-DEXTROAMPHETAMINE 30 MG PO TABS: 30.0000 mg | ORAL_TABLET | Freq: Two times a day (BID) | ORAL | 0 refills | Status: DC

## 2022-11-18 ENCOUNTER — Other Ambulatory Visit: Payer: Self-pay

## 2022-11-18 DIAGNOSIS — F909 Attention-deficit hyperactivity disorder, unspecified type: Secondary | ICD-10-CM

## 2022-11-18 NOTE — Telephone Encounter (Signed)
I am not able to refuse due to the type of medicine. KH

## 2022-11-18 NOTE — Telephone Encounter (Signed)
Please advise KH 

## 2022-11-20 ENCOUNTER — Other Ambulatory Visit: Payer: Self-pay | Admitting: Nurse Practitioner

## 2022-11-20 DIAGNOSIS — E1165 Type 2 diabetes mellitus with hyperglycemia: Secondary | ICD-10-CM

## 2022-11-26 ENCOUNTER — Other Ambulatory Visit: Payer: Self-pay | Admitting: Nurse Practitioner

## 2022-11-26 DIAGNOSIS — E1165 Type 2 diabetes mellitus with hyperglycemia: Secondary | ICD-10-CM

## 2022-11-26 NOTE — Telephone Encounter (Signed)
Asking about his referral (Questions)

## 2022-12-03 ENCOUNTER — Encounter (HOSPITAL_COMMUNITY): Payer: Self-pay

## 2022-12-15 ENCOUNTER — Other Ambulatory Visit (HOSPITAL_COMMUNITY): Payer: Self-pay | Admitting: Nurse Practitioner

## 2022-12-15 DIAGNOSIS — F909 Attention-deficit hyperactivity disorder, unspecified type: Secondary | ICD-10-CM

## 2022-12-15 MED ORDER — AMPHETAMINE-DEXTROAMPHETAMINE 30 MG PO TABS: 30.0000 mg | ORAL_TABLET | Freq: Two times a day (BID) | ORAL | 0 refills | Status: DC

## 2022-12-16 ENCOUNTER — Other Ambulatory Visit (HOSPITAL_COMMUNITY): Payer: Self-pay | Admitting: Nurse Practitioner

## 2022-12-16 ENCOUNTER — Telehealth: Payer: Self-pay | Admitting: Nurse Practitioner

## 2022-12-16 DIAGNOSIS — F909 Attention-deficit hyperactivity disorder, unspecified type: Secondary | ICD-10-CM

## 2022-12-16 MED ORDER — AMPHETAMINE-DEXTROAMPHETAMINE 30 MG PO TABS: 30.0000 mg | ORAL_TABLET | Freq: Two times a day (BID) | ORAL | 0 refills | Status: DC

## 2022-12-16 NOTE — Progress Notes (Unsigned)
Refilling Adderall early because 11/28  is a public holiday  and pharmacy will not be opened ders Signed This Visit (1)   amphetamine-dextroamphetamine (ADDERALL) 30 MG tablet        Take 1 tablet by mouth 2 (two) times daily., Starting Wed 12/17/2022, Until Sat 01/17/2023, Normal

## 2022-12-16 NOTE — Telephone Encounter (Signed)
Pt is wanting to know if his medication can be filled today  he needs the medication before thanksgiving

## 2023-01-02 ENCOUNTER — Telehealth: Payer: Self-pay

## 2023-01-02 NOTE — Telephone Encounter (Signed)
Copied from CRM [id number]. Topic: General - Other >> Jan 02, 2023  2:42 PM Orinda Kenner C wrote: Reason for CRM: Clydie Braun [facility] [phone] they noticed pt has diabetes may not be on statin per ADA guidelines, pls consider prescribing a statin and sending to the pharmacy before the end of the year. They will also send a fax.  Please advise [facility]

## 2023-01-05 NOTE — Telephone Encounter (Signed)
Clydie Braun advised. KH

## 2023-01-09 ENCOUNTER — Other Ambulatory Visit (HOSPITAL_COMMUNITY): Payer: Self-pay | Admitting: Nurse Practitioner

## 2023-01-09 DIAGNOSIS — F909 Attention-deficit hyperactivity disorder, unspecified type: Secondary | ICD-10-CM

## 2023-01-15 ENCOUNTER — Other Ambulatory Visit: Payer: Self-pay | Admitting: Nurse Practitioner

## 2023-01-15 ENCOUNTER — Other Ambulatory Visit: Payer: Self-pay

## 2023-01-15 DIAGNOSIS — F909 Attention-deficit hyperactivity disorder, unspecified type: Secondary | ICD-10-CM

## 2023-01-15 MED ORDER — AMPHETAMINE-DEXTROAMPHETAMINE 30 MG PO TABS: 30.0000 mg | ORAL_TABLET | Freq: Two times a day (BID) | ORAL | 0 refills | Status: DC

## 2023-01-15 NOTE — Telephone Encounter (Signed)
Please advise KH 

## 2023-02-13 ENCOUNTER — Ambulatory Visit: Payer: Self-pay | Admitting: Nurse Practitioner

## 2023-02-13 ENCOUNTER — Other Ambulatory Visit: Payer: Self-pay

## 2023-02-13 DIAGNOSIS — F909 Attention-deficit hyperactivity disorder, unspecified type: Secondary | ICD-10-CM

## 2023-02-13 MED ORDER — AMPHETAMINE-DEXTROAMPHETAMINE 30 MG PO TABS: 30.0000 mg | ORAL_TABLET | Freq: Two times a day (BID) | ORAL | 0 refills | Status: DC

## 2023-02-13 NOTE — Telephone Encounter (Signed)
Please advise in [place] absence. KH

## 2023-02-25 ENCOUNTER — Other Ambulatory Visit: Payer: Self-pay

## 2023-02-25 ENCOUNTER — Other Ambulatory Visit: Payer: Self-pay | Admitting: Nurse Practitioner

## 2023-02-25 DIAGNOSIS — I10 Essential (primary) hypertension: Secondary | ICD-10-CM

## 2023-02-25 MED ORDER — LISINOPRIL 10 MG PO TABS: 10.0000 mg | ORAL_TABLET | Freq: Every day | ORAL | 1 refills | Status: DC

## 2023-02-27 ENCOUNTER — Other Ambulatory Visit: Payer: Self-pay | Admitting: Nurse Practitioner

## 2023-02-27 DIAGNOSIS — E1165 Type 2 diabetes mellitus with hyperglycemia: Secondary | ICD-10-CM

## 2023-03-06 ENCOUNTER — Encounter (HOSPITAL_COMMUNITY): Payer: Self-pay

## 2023-03-06 ENCOUNTER — Encounter: Payer: Self-pay | Admitting: Nurse Practitioner

## 2023-03-06 ENCOUNTER — Ambulatory Visit (INDEPENDENT_AMBULATORY_CARE_PROVIDER_SITE_OTHER): Payer: 59 | Admitting: Nurse Practitioner

## 2023-03-06 VITALS — BP 120/84 | HR 107 | Wt 257.0 lb

## 2023-03-06 DIAGNOSIS — E1165 Type 2 diabetes mellitus with hyperglycemia: Secondary | ICD-10-CM

## 2023-03-06 DIAGNOSIS — Z720 Tobacco use: Secondary | ICD-10-CM | POA: Diagnosis not present

## 2023-03-06 DIAGNOSIS — E669 Obesity, unspecified: Secondary | ICD-10-CM | POA: Diagnosis not present

## 2023-03-06 DIAGNOSIS — F909 Attention-deficit hyperactivity disorder, unspecified type: Secondary | ICD-10-CM

## 2023-03-06 DIAGNOSIS — L309 Dermatitis, unspecified: Secondary | ICD-10-CM

## 2023-03-06 DIAGNOSIS — E785 Hyperlipidemia, unspecified: Secondary | ICD-10-CM | POA: Diagnosis not present

## 2023-03-06 DIAGNOSIS — I10 Essential (primary) hypertension: Secondary | ICD-10-CM

## 2023-03-06 MED ORDER — SEMAGLUTIDE (2 MG/DOSE) 8 MG/3ML ~~LOC~~ SOPN: 2.0000 mg | PEN_INJECTOR | SUBCUTANEOUS | 3 refills | Status: DC

## 2023-03-06 MED ORDER — TRIAMCINOLONE ACETONIDE 0.025 % EX OINT: 1.0000 | TOPICAL_OINTMENT | Freq: Two times a day (BID) | CUTANEOUS | 0 refills | Status: AC

## 2023-03-06 MED ORDER — AMLODIPINE BESYLATE 5 MG PO TABS: 5.0000 mg | ORAL_TABLET | Freq: Every day | ORAL | 1 refills | Status: AC

## 2023-03-06 NOTE — Assessment & Plan Note (Signed)
Smokes about 0.5 pack/day  Asked about quitting: confirms that he/she currently smokes cigarettes Advise to quit smoking: Educated about QUITTING to reduce the risk of cancer, cardio and cerebrovascular disease. Assess willingness: Unwilling to quit at this time, but is working on cutting back. Assist with counseling and pharmacotherapy: Counseled for 5 minutes and literature provided. Arrange for follow up: follow up in 4 months and continue to offer help.

## 2023-03-06 NOTE — Assessment & Plan Note (Signed)
chronic medical condition currently well-controlled on Ozempic 1 mg once weekly injection He is looking at losing weight so we will increase Ozempic to 2 mg once weekly He does not check his blood sugar but patient denies signs of hypoglycemia CBG goals discussed, and encouraged to report hypoglycemia Patient encouraged to get his diabetic eye exam completed Patient counseled on low-carb diet, encouraged to engage in regular moderate exercises at least [place] 50 minutes weekly as tolerated

## 2023-03-06 NOTE — Assessment & Plan Note (Addendum)
Lab Results  Component Value Date   CHOL 132 10/17/2022   HDL 31 (L) 10/17/2022   LDLCALC 76 10/17/2022   LDLDIRECT 79 10/17/2022   TRIG 142 10/17/2022   CHOLHDL 4.3 10/17/2022  LDL goal is less than 70 Currently on atorvastatin 20 mg daily Check fasting lipid panel Check

## 2023-03-06 NOTE — Assessment & Plan Note (Signed)
Continue Adderall 30 mg twice daily Patient encouraged to schedule appointment with psychiatrist for med management

## 2023-03-06 NOTE — Patient Instructions (Signed)
1. Tobacco abuse (Primary)   2. Dyslipidemia, goal LDL below 70  - Lipid panel; Future  3. Obesity (BMI 30-39.9)   4. Primary hypertension  - CMP14+EGFR; Future  5. Uncontrolled type 2 diabetes mellitus with hyperglycemia (HCC)  - POCT glycosylated hemoglobin (Hb A1C) - Semaglutide, 2 MG/DOSE, 8 MG/3ML SOPN; Inject 2 mg as directed once a week.  Dispense: 3 mL; Refill: 3  6. Dermatitis  - triamcinolone (KENALOG) 0.025 % ointment; Apply 1 Application topically 2 (two) times daily.  Dispense: 30 g; Refill: 0      It is important that you exercise regularly at least 30 minutes 5 times a week as tolerated  Think about what you will eat, plan ahead. Choose " clean, green, fresh or frozen" over canned, processed or packaged foods which are more sugary, salty and fatty. 70 to 75% of food eaten should be vegetables and fruit. Three meals at set times with snacks allowed between meals, but they must be fruit or vegetables. Aim to eat over a 12 hour period , example 7 am to 7 pm, and STOP after  your last meal of the day. Drink water,generally about 64 ounces per day, no other drink is as healthy. Fruit juice is best enjoyed in a healthy way, by EATING the fruit.  Thanks for choosing [facility] we consider it a privelige to serve you.

## 2023-03-06 NOTE — Assessment & Plan Note (Addendum)
Patient complains of intermittent non  itchy red rashes on the face States that using low potency steroid cream in the past has been helpful  - triamcinolone (KENALOG) 0.025 % ointment; Apply 1 Application topically 2 (two) times daily.  Dispense: 30 g; Refill: 0

## 2023-03-06 NOTE — Assessment & Plan Note (Addendum)
Wt Readings from Last 3 Encounters:  03/06/23 257 lb (116.6 kg)  10/17/22 252 lb (114.3 kg)  07/21/22 258 lb (117 kg)   Body mass index is 39.08 kg/m.  Patient counseled on low-carb diet Encouraged engage in regular moderate exercise at least 150 minutes weekly as tolerated Will increase Ozempic to 2 mg once weekly for better weight management

## 2023-03-06 NOTE — Assessment & Plan Note (Signed)
Continue amlodipine 5 mg daily, lisinopril 10 mg daily DASH diet and commitment to daily physical activity for a minimum of 30 minutes discussed and encouraged, as a part of hypertension management.  Check a CMP     03/06/2023    2:50 PM 03/06/2023    2:39 PM 10/17/2022    2:39 PM 07/21/2022    3:37 PM 07/21/2022    3:03 PM 06/09/2022    1:37 PM 06/09/2022    1:19 PM  BP/Weight  Systolic BP 120 142 127 131 123 158 149  Diastolic BP 84 89 83 83 89 106 99  Wt. (Lbs)  257 252  258    BMI  39.08 kg/m2 38.32 kg/m2  39.23 kg/m2

## 2023-03-06 NOTE — Progress Notes (Signed)
Established Patient Office Visit  Subjective:  Patient ID: Jordan Madden, male    DOB: 1980-06-27  Age: 43 y.o. MRN: [id number]  CC:  Chief Complaint  Patient presents with   Diabetes   Medical Management of Chronic Issues    HPI Jordan Madden is a 43 y.o. male  has a past medical history of ADHD, Anxiety, Dyslipidemia, goal LDL below 70 (10/17/2022), High blood pressure, and Type 2 diabetes mellitus (HCC).  Patient present for follow-up for his chronic medical conditions. Patient denies any adverse reactions to current medication States that he is doing well generally  He has completed his ADHD testing  at [facility], stated that the testing  confirmed that he has ADHD and he plans to schedule an appointment with a psychiatrist  Due for Tdap vaccine, pneumococcal vaccine, need for vaccines discussed patient declined the vaccines today    .    Past Medical History:  Diagnosis Date   ADHD    Anxiety    Dyslipidemia, goal LDL below 70 10/17/2022   High blood pressure    Type 2 diabetes mellitus (HCC)     History reviewed. No pertinent surgical history.  Family History  Problem Relation Age of Onset   Alcoholism Mother    Alcoholism Father    Colon cancer Neg Hx    Cancer - Lung Neg Hx     Social History   Socioeconomic History   Marital status: Married    Spouse name: Not on file   Number of children: Not on file   Years of education: Not on file   Highest education level: Bachelor's degree (e.g., BA, AB, BS)  Occupational History   Not on file  Tobacco Use   Smoking status: Every Day    Current packs/day: 0.50    Types: Cigarettes   Smokeless tobacco: Never  Substance and Sexual Activity   Alcohol use: Not Currently   Drug use: Never   Sexual activity: Yes  Other Topics Concern   Not on file  Social History Narrative   Lives with his spouse    Social Drivers of Health   Financial Resource Strain: Medium Risk (02/09/2023)    Overall Financial Resource Strain (CARDIA)    Difficulty of Paying Living Expenses: Somewhat hard  Food Insecurity: No Food Insecurity (02/09/2023)   Hunger Vital Sign    Worried About Running Out of Food in the Last Year: Never true    Ran Out of Food in the Last Year: Never true  Transportation Needs: No Transportation Needs (02/09/2023)   PRAPARE - Administrator, Civil Service (Medical): No    Lack of Transportation (Non-Medical): No  Physical Activity: Insufficiently Active (02/09/2023)   Exercise Vital Sign    Days of Exercise per Week: 4 days    Minutes of Exercise per Session: 30 min  Stress: Stress Concern Present (02/09/2023)   [employer] of Occupational Health - Occupational Stress Questionnaire    Feeling of Stress : To some extent  Social Connections: Moderately Integrated (02/09/2023)   Social Connection and Isolation Panel [NHANES]    Frequency of Communication with Friends and Family: Twice a week    Frequency of Social Gatherings with Friends and Family: Once a week    Attends Religious Services: Never    Database administrator or Organizations: Yes    Attends Banker Meetings: 1 to 4 times per year    Marital Status: Married  Catering manager Violence:  Not on file    Outpatient Medications Prior to Visit  Medication Sig Dispense Refill   amphetamine-dextroamphetamine (ADDERALL) 30 MG tablet Take 1 tablet by mouth 2 (two) times daily. 60 tablet 0   atorvastatin (LIPITOR) 20 MG tablet Take 1 tablet (20 mg total) by mouth daily. 90 tablet 1   lisinopril (ZESTRIL) 10 MG tablet Take 1 tablet (10 mg total) by mouth daily. 90 tablet 1   SUBOXONE 8-2 MG FILM SMARTSIG:2.5 Strip(s) Sublingual Daily     amLODipine (NORVASC) 5 MG tablet Take 1 tablet (5 mg total) by mouth daily. 90 tablet 1   OZEMPIC, 1 MG/DOSE, 4 MG/3ML SOPN INJECT 1 MG UNDER THE SKIN ONCE A WEEK 3 mL 2   triamcinolone cream (KENALOG) 0.1 % Apply 1 Application topically 2 (two)  times daily. 30 g 0   albuterol (VENTOLIN HFA) 108 (90 Base) MCG/ACT inhaler Inhale 1-2 puffs into the lungs every 6 (six) hours as needed for wheezing or shortness of breath. (Patient not taking: Reported on 03/06/2023) 18 g 0   Blood Glucose Monitoring Suppl DEVI 1 each by Does not apply route in the morning, at noon, and at bedtime. May substitute to any manufacturer covered by patient's insurance. (Patient not taking: Reported on 03/06/2023) 1 each 0   Blood Pressure Monitoring (BLOOD PRESSURE MONITOR AUTOMAT) DEVI Please check your blood pressure as directed. (Patient not taking: Reported on 03/06/2023) 1 each 0   No facility-administered medications prior to visit.    Allergies  Allergen Reactions   Bee Pollen     ROS Review of Systems  Constitutional:  Negative for appetite change, chills, fatigue and fever.  HENT:  Negative for congestion, postnasal drip, rhinorrhea and sneezing.   Respiratory:  Negative for cough, shortness of breath and wheezing.   Cardiovascular:  Negative for chest pain, palpitations and leg swelling.  Gastrointestinal:  Negative for abdominal pain, constipation, nausea and vomiting.  Genitourinary:  Negative for difficulty urinating, dysuria, flank pain and frequency.  Musculoskeletal:  Negative for arthralgias, back pain, joint swelling and myalgias.  Skin:  Positive for rash. Negative for color change, pallor and wound.  Neurological:  Negative for dizziness, facial asymmetry, weakness, numbness and headaches.  Psychiatric/Behavioral:  Negative for behavioral problems, confusion, self-injury and suicidal ideas.       Objective:    Physical Exam Vitals and nursing note reviewed.  Constitutional:      General: He is not in acute distress.    Appearance: Normal appearance. He is not ill-appearing, toxic-appearing or diaphoretic.  HENT:     Mouth/Throat:     Mouth: Mucous membranes are moist.     Pharynx: Oropharynx is clear. No oropharyngeal exudate or  posterior oropharyngeal erythema.  Eyes:     General: No scleral icterus.       Right eye: No discharge.        Left eye: No discharge.     Extraocular Movements: Extraocular movements intact.     Conjunctiva/sclera: Conjunctivae normal.  Cardiovascular:     Rate and Rhythm: Normal rate and regular rhythm.     Pulses: Normal pulses.     Heart sounds: Normal heart sounds. No murmur heard.    No friction rub. No gallop.  Pulmonary:     Effort: Pulmonary effort is normal. No respiratory distress.     Breath sounds: Normal breath sounds. No stridor. No wheezing, rhonchi or rales.  Chest:     Chest wall: No tenderness.  Abdominal:  General: There is no distension.     Palpations: Abdomen is soft.     Tenderness: There is no abdominal tenderness. There is no right CVA tenderness, left CVA tenderness or guarding.  Musculoskeletal:        General: No swelling, tenderness, deformity or signs of injury.     Right lower leg: No edema.     Left lower leg: No edema.  Skin:    General: Skin is warm and dry.     Capillary Refill: Capillary refill takes less than 2 seconds.     Coloration: Skin is not jaundiced or pale.     Findings: Rash present. No bruising, erythema or lesion.     Comments: Erythematous rashes noted on the face  Neurological:     Mental Status: He is alert and oriented to person, place, and time.     Motor: No weakness.     Coordination: Coordination normal.     Gait: Gait normal.  Psychiatric:        Mood and Affect: Mood normal.        Behavior: Behavior normal.        Thought Content: Thought content normal.        Judgment: Judgment normal.     BP 120/84   Pulse (!) 107   Wt 257 lb (116.6 kg)   SpO2 99%   BMI 39.08 kg/m  Wt Readings from Last 3 Encounters:  03/06/23 257 lb (116.6 kg)  10/17/22 252 lb (114.3 kg)  07/21/22 258 lb (117 kg)    No results found for: "TSH" No results found for: "WBC", "HGB", "HCT", "MCV", "PLT" Lab Results  Component  Value Date   NA 141 06/09/2022   K 4.3 06/09/2022   CO2 27 06/09/2022   GLUCOSE 200 (H) 06/09/2022   BUN 13 06/09/2022   CREATININE 0.83 06/09/2022   BILITOT 0.5 05/12/2022   ALKPHOS 136 (H) 05/12/2022   AST 25 05/12/2022   ALT 34 05/12/2022   PROT 6.9 05/12/2022   ALBUMIN 4.0 (L) 05/12/2022   CALCIUM 9.7 06/09/2022   EGFR 111 06/09/2022   Lab Results  Component Value Date   CHOL 132 10/17/2022   Lab Results  Component Value Date   HDL 31 (L) 10/17/2022   Lab Results  Component Value Date   LDLCALC 76 10/17/2022   Lab Results  Component Value Date   TRIG 142 10/17/2022   Lab Results  Component Value Date   CHOLHDL 4.3 10/17/2022   Lab Results  Component Value Date   HGBA1C 6.5 (A) 10/17/2022   HGBA1C 6.5 10/17/2022   HGBA1C 6.5 10/17/2022      Assessment & Plan:   Problem List Items Addressed This Visit       Cardiovascular and Mediastinum   High blood pressure   Continue amlodipine 5 mg daily, lisinopril 10 mg daily DASH diet and commitment to daily physical activity for a minimum of 30 minutes discussed and encouraged, as a part of hypertension management.  Check a CMP     03/06/2023    2:50 PM 03/06/2023    2:39 PM 10/17/2022    2:39 PM 07/21/2022    3:37 PM 07/21/2022    3:03 PM 06/09/2022    1:37 PM 06/09/2022    1:19 PM  BP/Weight  Systolic BP 120 142 127 131 123 158 149  Diastolic BP 84 89 83 83 89 106 99  Wt. (Lbs)  257 252  258    BMI  39.08 kg/m2 38.32 kg/m2  39.23 kg/m2             Relevant Medications   amLODipine (NORVASC) 5 MG tablet   Other Relevant Orders   CMP14+EGFR     Endocrine   Controlled type 2 diabetes mellitus with hyperglycemia (HCC)   chronic medical condition currently well-controlled on Ozempic 1 mg once weekly injection He is looking at losing weight so we will increase Ozempic to 2 mg once weekly He does not check his blood sugar but patient denies signs of hypoglycemia CBG goals discussed, and encouraged to  report hypoglycemia Patient encouraged to get his diabetic eye exam completed Patient counseled on low-carb diet, encouraged to engage in regular moderate exercises at least [place] 50 minutes weekly as tolerated      Relevant Medications   Semaglutide, 2 MG/DOSE, 8 MG/3ML SOPN   Other Relevant Orders   POCT glycosylated hemoglobin (Hb A1C)     Musculoskeletal and Integument   Dermatitis   Patient complains of intermittent normal itchy red rashes on the face States that using low potency steroid cream in the past has been helpful  - triamcinolone (KENALOG) 0.025 % ointment; Apply 1 Application topically 2 (two) times daily.  Dispense: 30 g; Refill: 0       Relevant Medications   triamcinolone (KENALOG) 0.025 % ointment     Other   Attention deficit hyperactivity disorder (ADHD)   Continue Adderall 30 mg twice daily Patient encouraged to schedule appointment with psychiatrist for med management      Obesity (BMI 30-39.9)   Wt Readings from Last 3 Encounters:  03/06/23 257 lb (116.6 kg)  10/17/22 252 lb (114.3 kg)  07/21/22 258 lb (117 kg)   Body mass index is 39.08 kg/m.  Patient counseled on low-carb diet Encouraged engage in regular moderate exercise at least 150 minutes weekly as tolerated Will increase Ozempic to 2 mg once weekly for better weight management      Relevant Medications   Semaglutide, 2 MG/DOSE, 8 MG/3ML SOPN   Tobacco abuse - Primary   Smokes about 0.5 pack/day   Asked about quitting: confirms that he/she currently smokes cigarettes Advise to quit smoking: Educated about QUITTING to reduce the risk of cancer, cardio and cerebrovascular disease. Assess willingness: Unwilling to quit at this time, but is working on cutting back. Assist with counseling and pharmacotherapy: Counseled for 5 minutes and literature provided. Arrange for follow up: follow up in 4 months and continue to offer help.       Dyslipidemia, goal LDL below 70   Lab Results   Component Value Date   CHOL 132 10/17/2022   HDL 31 (L) 10/17/2022   LDLCALC 76 10/17/2022   LDLDIRECT 79 10/17/2022   TRIG 142 10/17/2022   CHOLHDL 4.3 10/17/2022  LDL goal is less than 70 Currently on atorvastatin 20 mg daily Check fasting lipid panel Check      Relevant Medications   amLODipine (NORVASC) 5 MG tablet   Other Relevant Orders   Lipid panel    Meds ordered this encounter  Medications   Semaglutide, 2 MG/DOSE, 8 MG/3ML SOPN    Sig: Inject 2 mg as directed once a week.    Dispense:  3 mL    Refill:  3   triamcinolone (KENALOG) 0.025 % ointment    Sig: Apply 1 Application topically 2 (two) times daily.    Dispense:  30 g    Refill:  0   amLODipine (NORVASC) 5  MG tablet    Sig: Take 1 tablet (5 mg total) by mouth daily.    Dispense:  90 tablet    Refill:  1    Follow-up: Return in about 4 months (around 07/04/2023) for CPE.    Donell Beers, FNP

## 2023-03-09 LAB — POCT GLYCOSYLATED HEMOGLOBIN (HGB A1C): Hemoglobin A1C: 6.4 % — AB (ref 4.0–5.6)

## 2023-03-16 ENCOUNTER — Other Ambulatory Visit: Payer: Self-pay | Admitting: Nurse Practitioner

## 2023-03-16 ENCOUNTER — Other Ambulatory Visit: Payer: Self-pay

## 2023-03-16 DIAGNOSIS — F909 Attention-deficit hyperactivity disorder, unspecified type: Secondary | ICD-10-CM

## 2023-03-16 MED ORDER — AMPHETAMINE-DEXTROAMPHETAMINE 30 MG PO TABS: 30.0000 mg | ORAL_TABLET | Freq: Two times a day (BID) | ORAL | 0 refills | Status: DC

## 2023-03-16 NOTE — Progress Notes (Signed)
 Reviewed PDMP substance reporting system prior to prescribing opiate medications. No inconsistencies noted.      1. Attention deficit hyperactivity disorder (ADHD), unspecified ADHD type  - amphetamine-dextroamphetamine (ADDERALL) 30 MG tablet; Take 1 tablet by mouth 2 (two) times daily.  Dispense: 60 tablet; Refill: 0

## 2023-03-16 NOTE — Telephone Encounter (Signed)
 Please advise [facility]

## 2023-04-14 ENCOUNTER — Other Ambulatory Visit: Payer: Self-pay | Admitting: Nurse Practitioner

## 2023-04-14 DIAGNOSIS — F909 Attention-deficit hyperactivity disorder, unspecified type: Secondary | ICD-10-CM

## 2023-04-14 MED ORDER — AMPHETAMINE-DEXTROAMPHETAMINE 30 MG PO TABS
30.0000 mg | ORAL_TABLET | Freq: Two times a day (BID) | ORAL | 0 refills | Status: DC
Start: 2023-04-15 — End: 2023-04-23

## 2023-04-14 NOTE — Telephone Encounter (Signed)
 Copied from CRM [id number]. Topic: Clinical - Medication Refill >> Apr 14, 2023  2:44 PM [name] wrote: Most Recent Primary Care Visit:  Provider: Donell Beers  Department: [facility]  Visit Type: OFFICE VISIT  Date: 03/06/2023  Medication: amphetamine-dextroamphetamine (ADDERALL) 30 MG tablet - per note patient needs to see Psychiatrist to fill. Patient states he has reached out to provider Everlena Cooper and is waiting on appt opening. He is currently out of medication  Has the patient contacted their pharmacy? Yes (Agent: If no, request that the patient contact the pharmacy for the refill. If patient does not wish to contact the pharmacy document the reason why and proceed with request.) (Agent: If yes, when and what did the pharmacy advise?)  Is this the correct pharmacy for this prescription? Yes If no, delete pharmacy and type the correct one.  This is the patient's preferred pharmacy:  [id number] [place], [place] - [phone] AT [facility] [address] OF [facility] & [address] [place] [address] Phone: [phone] Fax: [phone]   Has the prescription been filled recently? Yes 03/16/2023  Is the patient out of the medication? Yes  Has the patient been seen for an appointment in the last year OR does the patient have an upcoming appointment? Yes  Can we respond through MyChart? Yes  Agent: Please be advised that Rx refills may take up to 3 business days. We ask that you follow-up with your pharmacy.

## 2023-04-20 NOTE — Progress Notes (Unsigned)
 BH MD/PA/NP OP Progress Note  04/23/2023 11:25 AM Jordan Madden  MRN:  [id number]  Visit Diagnosis:    ICD-10-CM   1. Attention deficit hyperactivity disorder (ADHD), combined type  F90.2 amphetamine-dextroamphetamine (ADDERALL) 30 MG tablet    amphetamine-dextroamphetamine (ADDERALL) 30 MG tablet    2. 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 Encounter for long-term (current) use of medications  Z79.899 ToxASSURE Select 13 (MW), Urine      Assessment: Jordan Madden is a 43 y.o. male with a history of GAD, HTN, DM, and reported ADHD who presented to [facility] for initial evaluation on 07/03/2022.  Patient reported a past history of anxiety and depression that has been stable since 2010.  During periods of increased anxiety he endorses symptoms of restlessness, racing thoughts, excessive worry, and fear of something awful happening.  At initial evaluation patients primary concern was his ADHD symptoms.  He discussed struggling with poor concentration/focus, difficulty completing tasks, being easily distracted, and poor memory.  He has improvement on his symptoms with stimulant medication which has been prescribed since he was 16 when he was initially diagnosed.  Patient had not completed neuropsych testing in the past.  Of note patient does have a dx of opiate use disorder which has been on Suboxone maintenance since around 2009.  Patient completed neuropsych testing which confirmed ADHD diagnosis.  Jordan Madden presents for follow-up evaluation. Today, 04/23/23, patient reports that his mood and ADHD symptoms have been stable while consistently taking the Adderall 30 mg twice a day.  Of note he did complete the neuropsych testing in the interim and reports that it was positive for ADHD.  Patient will send in a copy of the testing results.  We agreed to take over the prescription presenting patient's symptoms and the results and is  compliant with urine drug screens over 6 to 12 months.  Patient was agreeable to this.  We will continue on his current regimen and follow-up in 3 months.  Plan: - Continue Adderall 30 mg BID - Continue Suboxone 8-2 mg film daily, 4-1 mg QHS, managed by Dr. Demetrio Lapping - CMP and A1c reviewed - Patient to send in Neuropsych testing  - Crisis resources reviewed - Follow up in a 3 months  Chief Complaint:  Chief Complaint  Patient presents with   Follow-up   HPI: Jordan Madden presents reporting that things have been going great for him over the past year. He has been staying busy with work and things at home. Some anxiety as he is the only one working in his family and he is feeling some of the financial stressor. He finds this manageable day-to-day without any particular concerns.  Patient reports that his sobriety continues to go well and is a nonissue.  He is in the process of trying to cut down on his Suboxone and is using an 8 mg film in the morning and 4 mg film in the evening now.  Patient reports that he completed neuropsych testing in the interim and would like to transition his management over to this provider now.  He reports that the testing was positive for ADHD and will send in a copy of his neuropsych testing.  We explained that we would send in a refill to get him to his next visit though would need the testing results before anything further is sent in.  Patient expressed understanding.  He also was agreeable to urine drug screens  every 6 to 12 months to request to do it through his primary care.  We are agreeable with this as long as they are able to accommodate.  Patient reports that he stable on his current Adderall dosing of 30 mg twice a day.  He denies any adverse side effects.   Past Psychiatric History: Patient had followed at [facility] in the past. His provider there let and we was not pleased with the care from his new provider. He reports that he has multiple other providers  while in [place] starting when he was 16. No prior psychiatric hospitalizations or suicide attempts. He has had multiple therapists in the past. Has gone to couples counseling in the past. Has not had it in 10 years.  Patient has been prescribed Effexor (only took short term) and Zoloft (felt lethargic) in the past.   Smokes about half pack of cigarettes daily since age 68. Patient endorses marijauna use daily. Denies any other substance use currently. Used cocaine in college. Had an opiate use disorder after college. He had developed an opiate addiction after being prescribed pain medication. Been on suboxone for 15 years now. Denies alcohol use in 15 years.  Past Medical History:  Past Medical History:  Diagnosis Date   ADHD    Anxiety    Dyslipidemia, goal LDL below 70 10/17/2022   High blood pressure    Type 2 diabetes mellitus (HCC)    No past surgical history on file.  Family History:  Family History  Problem Relation Age of Onset   Alcoholism Mother    Alcoholism Father    Colon cancer Neg Hx    Cancer - Lung Neg Hx     Social History:  Social History   Socioeconomic History   Marital status: Married    Spouse name: Not on file   Number of children: Not on file   Years of education: Not on file   Highest education level: Bachelor's degree (e.g., BA, AB, BS)  Occupational History   Not on file  Tobacco Use   Smoking status: Every Day    Current packs/day: 0.50    Types: Cigarettes   Smokeless tobacco: Never  Substance and Sexual Activity   Alcohol use: Not Currently   Drug use: Never   Sexual activity: Yes  Other Topics Concern   Not on file  Social History Narrative   Lives with his spouse    Social Drivers of Health   Financial Resource Strain: Medium Risk (02/09/2023)   Overall Financial Resource Strain (CARDIA)    Difficulty of Paying Living Expenses: Somewhat hard  Food Insecurity: No Food Insecurity (02/09/2023)   Hunger Vital Sign    Worried  About Running Out of Food in the Last Year: Never true    Ran Out of Food in the Last Year: Never true  Transportation Needs: No Transportation Needs (02/09/2023)   PRAPARE - Administrator, Civil Service (Medical): No    Lack of Transportation (Non-Medical): No  Physical Activity: Insufficiently Active (02/09/2023)   Exercise Vital Sign    Days of Exercise per Week: 4 days    Minutes of Exercise per Session: 30 min  Stress: Stress Concern Present (02/09/2023)   [employer] of Occupational Health - Occupational Stress Questionnaire    Feeling of Stress : To some extent  Social Connections: Moderately Integrated (02/09/2023)   Social Connection and Isolation Panel [NHANES]    Frequency of Communication with Friends and Family: Twice a  week    Frequency of Social Gatherings with Friends and Family: Once a week    Attends Religious Services: Never    Database administrator or Organizations: Yes    Attends Banker Meetings: 1 to 4 times per year    Marital Status: Married    Allergies:  Allergies  Allergen Reactions   Bee Pollen     Current Medications: Current Outpatient Medications  Medication Sig Dispense Refill   [START ON 06/12/2023] amphetamine-dextroamphetamine (ADDERALL) 30 MG tablet Take 1 tablet by mouth daily. 30 tablet 0   albuterol (VENTOLIN HFA) 108 (90 Base) MCG/ACT inhaler Inhale 1-2 puffs into the lungs every 6 (six) hours as needed for wheezing or shortness of breath. (Patient not taking: Reported on 03/06/2023) 18 g 0   amLODipine (NORVASC) 5 MG tablet Take 1 tablet (5 mg total) by mouth daily. 90 tablet 1   [START ON 05/13/2023] amphetamine-dextroamphetamine (ADDERALL) 30 MG tablet Take 1 tablet by mouth 2 (two) times daily. 60 tablet 0   atorvastatin (LIPITOR) 20 MG tablet Take 1 tablet (20 mg total) by mouth daily. 90 tablet 1   Blood Glucose Monitoring Suppl DEVI 1 each by Does not apply route in the morning, at noon, and at bedtime. May  substitute to any manufacturer covered by patient's insurance. (Patient not taking: Reported on 03/06/2023) 1 each 0   Blood Pressure Monitoring (BLOOD PRESSURE MONITOR AUTOMAT) DEVI Please check your blood pressure as directed. (Patient not taking: Reported on 03/06/2023) 1 each 0   lisinopril (ZESTRIL) 10 MG tablet Take 1 tablet (10 mg total) by mouth daily. 90 tablet 1   Semaglutide, 2 MG/DOSE, 8 MG/3ML SOPN Inject 2 mg as directed once a week. 3 mL 3   SUBOXONE 8-2 MG FILM SMARTSIG:2.5 Strip(s) Sublingual Daily     triamcinolone (KENALOG) 0.025 % ointment Apply 1 Application topically 2 (two) times daily. 30 g 0   No current facility-administered medications for this visit.     Psychiatric Specialty Exam: Review of Systems  There were no vitals taken for this visit.There is no height or weight on file to calculate BMI.  General Appearance: Well Groomed  Eye Contact:  Good  Speech:  Clear and Coherent and Normal Rate  Volume:  Normal  Mood:  Euthymic and mild anxiety  Affect:  Appropriate  Thought Process:  Coherent  Orientation:  Full (Time, Place, and Person)  Thought Content: Logical   Suicidal Thoughts:  No  Homicidal Thoughts:  No  Memory:  Immediate;   Good  Judgement:  Good  Insight:  Good  Psychomotor Activity:  Normal  Concentration:  Concentration: Good  Recall:  Good  Fund of Knowledge: Good  Language: Good  Akathisia:  NA    AIMS (if indicated): not done  Assets:  Communication Skills Desire for Improvement Financial Resources/Insurance Housing Talents/Skills Transportation Vocational/Educational  ADL's:  Intact  Cognition: WNL  Sleep:  Good   Metabolic Disorder Labs: Lab Results  Component Value Date   HGBA1C 6.4 (A) 03/09/2023   No results found for: "PROLACTIN" Lab Results  Component Value Date   CHOL 132 10/17/2022   TRIG 142 10/17/2022   HDL 31 (L) 10/17/2022   CHOLHDL 4.3 10/17/2022   LDLCALC 76 10/17/2022   LDLCALC 117 (H) 08/20/2022    No results found for: "TSH"  Therapeutic Level Labs: No results found for: "LITHIUM" No results found for: "VALPROATE" No results found for: "CBMZ"   Screenings: GAD-7  Flowsheet Row Office Visit from 03/06/2023 in [facility] Of Eligha Bridegroom [facility] Office Visit from 07/03/2022 in [facility]  Total GAD-7 Score 6 0      PHQ2-9    Flowsheet Row Office Visit from 03/06/2023 in [facility] Dept Of Eligha Bridegroom [facility] Office Visit from 10/17/2022 in [facility] Dept Of Eligha Bridegroom [facility] Office Visit from 07/03/2022 in [facility] Office Visit from 06/09/2022 in [facility] Dept Of Eligha Bridegroom [facility] Office Visit from 05/12/2022 in [facility] - [facility] Dept Of Eligha Bridegroom [facility]  PHQ-2 Total Score 0 0 0 0 0  PHQ-9 Total Score -- -- -- 5 1      Flowsheet Row [facility] from 05/10/2021 in [facility] ED from 12/28/2020 in [facility] RISK CATEGORY No Risk No Risk Error: Question 6 not populated       Collaboration of Care: Collaboration of Care: Medication Management AEB medication prescription and Primary Care Provider AEB chart review  Patient/Guardian was advised Release of Information must be obtained prior to any record release in order to collaborate their care with an outside provider. Patient/Guardian was advised if they have not already done so to contact the registration department to sign all necessary forms in order for [place] to release information regarding their care.   Consent: Patient/Guardian gives verbal consent for treatment and assignment of benefits for services provided during this visit.  Patient/Guardian expressed understanding and agreed to proceed.    Stasia Cavalier, MD 04/23/2023, 11:25 AM   Virtual Visit via Video Note  I connected with Jordan Madden on 04/23/23 at 11:00 AM EDT by a video enabled telemedicine application and verified that I am speaking with the correct person using two identifiers.  Location: Patient: Home Provider: Home Office   I discussed the limitations of evaluation and management by telemedicine and the availability of in person appointments. The patient expressed understanding and agreed to proceed.   I discussed the assessment and treatment plan with the patient. The patient was provided an opportunity to ask questions and all were answered. The patient agreed with the plan and demonstrated an understanding of the instructions.   The patient was advised to call back or seek an in-person evaluation if the symptoms worsen or if the condition fails to improve as anticipated.  I provided 10 minutes of non-face-to-face time during this encounter.   Stasia Cavalier, MD

## 2023-04-23 ENCOUNTER — Encounter (HOSPITAL_COMMUNITY): Payer: Self-pay | Admitting: Psychiatry

## 2023-04-23 ENCOUNTER — Telehealth (HOSPITAL_COMMUNITY): Admitting: Psychiatry

## 2023-04-23 DIAGNOSIS — Z79899 Other long term (current) drug therapy: Secondary | ICD-10-CM | POA: Diagnosis not present

## 2023-04-23 DIAGNOSIS — F1120 Opioid dependence, uncomplicated: Secondary | ICD-10-CM

## 2023-04-23 DIAGNOSIS — F902 Attention-deficit hyperactivity disorder, combined type: Secondary | ICD-10-CM | POA: Diagnosis not present

## 2023-04-23 MED ORDER — AMPHETAMINE-DEXTROAMPHETAMINE 30 MG PO TABS: 30.0000 mg | ORAL_TABLET | Freq: Two times a day (BID) | ORAL | 0 refills | Status: DC

## 2023-04-23 MED ORDER — AMPHETAMINE-DEXTROAMPHETAMINE 30 MG PO TABS
30.0000 mg | ORAL_TABLET | Freq: Every day | ORAL | 0 refills | Status: DC
Start: 2023-06-12 — End: 2023-06-16

## 2023-04-29 ENCOUNTER — Encounter (HOSPITAL_COMMUNITY): Payer: Self-pay

## 2023-06-12 ENCOUNTER — Encounter (HOSPITAL_COMMUNITY): Payer: Self-pay

## 2023-06-16 ENCOUNTER — Other Ambulatory Visit (HOSPITAL_COMMUNITY): Payer: Self-pay | Admitting: Psychiatry

## 2023-06-16 DIAGNOSIS — F902 Attention-deficit hyperactivity disorder, combined type: Secondary | ICD-10-CM

## 2023-06-16 MED ORDER — AMPHETAMINE-DEXTROAMPHETAMINE 30 MG PO TABS: 30.0000 mg | ORAL_TABLET | Freq: Two times a day (BID) | ORAL | 0 refills | Status: DC

## 2023-06-16 NOTE — Progress Notes (Signed)
 This provider is covering for Dr. Sharalyn Dasen while out of the office. Received message from patient that he picked up most recent prescription for Adderall 30 mg and was given quantity of 30 instead of typical 60 (confirmed via PDMP review last fill 06/12/23). Last seen by Dr. Sharalyn Dasen on 04/23/23 and at that time plan was to continue Adderall 30 mg BID with no mention of reduction. Appears decreased quantity was likely sent in error.   Will send in additional 30 tablets to allow patient to continue typical BID dosing this month until Dr. Sharalyn Dasen returns to review next fill.  Jordan Hones, MD 06/16/23

## 2023-07-08 ENCOUNTER — Telehealth (HOSPITAL_COMMUNITY): Payer: Self-pay | Admitting: *Deleted

## 2023-07-08 DIAGNOSIS — F902 Attention-deficit hyperactivity disorder, combined type: Secondary | ICD-10-CM

## 2023-07-08 MED ORDER — AMPHETAMINE-DEXTROAMPHETAMINE 30 MG PO TABS: 30.0000 mg | ORAL_TABLET | Freq: Two times a day (BID) | ORAL | 0 refills | Status: DC

## 2023-07-08 NOTE — Telephone Encounter (Signed)
 Pt called requesting a refill of the Adderall 30 mg BID. Last prescription dated for 06/16/23.  Last visit: 04/23/23 F/U appt: 07/14/23  Please send to [facility]

## 2023-07-13 NOTE — Progress Notes (Unsigned)
 BH MD/PA/NP OP Progress Note  07/14/2023 11:17 AM Jordan Madden  MRN:  [id number]  Visit Diagnosis:    ICD-10-CM   1. Attention deficit hyperactivity disorder (ADHD), combined type  F90.2 amphetamine -dextroamphetamine  (ADDERALL) 30 MG tablet    amphetamine -dextroamphetamine  (ADDERALL) 30 MG tablet    ToxASSURE Select 13 (MW), Urine    2. 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 Encounter for long-term (current) use of medications  Z79.899 ToxASSURE Select 13 (MW), Urine    4. GAD (generalized anxiety disorder)  F41.1 cloNIDine (CATAPRES) 0.1 MG tablet     Assessment: Jordan Madden is a 43 y.o. male with a history of GAD, HTN, DM, and reported ADHD who presented to [facility] for initial evaluation on 07/03/2022.  Patient reported a past history of anxiety and depression that has been stable since 2010.  During periods of increased anxiety he endorses symptoms of restlessness, racing thoughts, excessive worry, and fear of something awful happening.  At initial evaluation patients primary concern was his ADHD symptoms.  He discussed struggling with poor concentration/focus, difficulty completing tasks, being easily distracted, and poor memory.  He has improvement on his symptoms with stimulant medication which has been prescribed since he was 16 when he was initially diagnosed.  Patient had not completed neuropsych testing in the past.  Of note patient does have a dx of opiate use disorder which has been on Suboxone maintenance since around 2009.  Patient completed neuropsych testing which confirmed ADHD diagnosis.  Jordan Madden presents for follow-up evaluation. Today, 07/14/23, patient reports that ADHD symptoms have been well controlled on the Adderall.  He denied any adverse side effects with good sleep and appetite still.  Neuropsych testing was reviewed which confirmed ADHD diagnosis and also did show generalized anxiety  disorder.  Patient had endorsed anxiety during his visit today.  Current symptoms are more situational and we will start on as needed clonidine.  Patient has used this in the past with good effect.  Risk and benefits were reviewed.  We will continue on the remainder of his current regimen and follow up in 3 months.  Patient will complete urine screen prior to next appointment.  Plan: - Continue Adderall 30 mg BID - Clonidine 0.1 mg prn for anxiety - Continue Suboxone 8-2 mg film daily, 4-1 mg QHS, managed by Dr. Aurea - CMP and A1c reviewed - Utox ordered, patient needs to complete before next appointment - Neuropsych testing reviewed, was positive for ADHD and GAD - Crisis resources reviewed - Follow up in a 3 months  Chief Complaint:  Chief Complaint  Patient presents with   Follow-up   HPI: Jordan Madden presents reporting that thing are good. He has been experiencing a bit of anxiety lately. He finds himself getting frustrated and upset more rapidly recently. There was an incident where she ran out of medication in the interim and he felt that it was his fault.  Discussed his anxiety and patient describes it as being more situational.  That being the case we suggested starting on a as needed medication for the anxiety.  Jordan Madden agreed and has tried clonidine in the past with good effect.  Risk and benefits of this were reviewed.  The Adderall has been working well to control his ADHD symptoms.  There was some initial period of increased symptomology when we decreased to the 30 mg twice a day dose.  However over time he is finding  his to be just as effective as the 3 times a day dosing.  Reviewed urine screen and patient will get in the interim.  He expressed his understanding that he needs to happen before his next appointment.  Past Psychiatric History: Patient had followed at [facility] in the past. His provider there let and we was not pleased with the care from his new provider. He  reports that he has multiple other providers while in [place]  starting when he was 16. No prior psychiatric hospitalizations or suicide attempts. He has had multiple therapists in the past. Has gone to couples counseling in the past. Has not had it in 10 years.  Patient has been prescribed Effexor (only took short term) and Zoloft (felt lethargic) in the past.   Smokes about half pack of cigarettes daily since age 36. Patient endorses marijauna use daily. Denies any other substance use currently. Used cocaine in college. Had an opiate use disorder after college. He had developed an opiate addiction after being prescribed pain medication. Been on suboxone for 15 years now. Denies alcohol use in 15 years.  Past Medical History:  Past Medical History:  Diagnosis Date   ADHD    Anxiety    Dyslipidemia, goal LDL below 70 10/17/2022   High blood pressure    Type 2 diabetes mellitus (HCC)    No past surgical history on file.  Family History:  Family History  Problem Relation Age of Onset   Alcoholism Mother    Alcoholism Father    Colon cancer Neg Hx    Cancer - Lung Neg Hx     Social History:  Social History   Socioeconomic History   Marital status: Married    Spouse name: Not on file   Number of children: Not on file   Years of education: Not on file   Highest education level: Bachelor's degree (e.g., BA, AB, BS)  Occupational History   Not on file  Tobacco Use   Smoking status: Every Day    Current packs/day: 0.50    Types: Cigarettes   Smokeless tobacco: Never  Substance and Sexual Activity   Alcohol use: Not Currently   Drug use: Never   Sexual activity: Yes  Other Topics Concern   Not on file  Social History Narrative   Lives with his spouse    Social Drivers of Health   Financial Resource Strain: Medium Risk (02/09/2023)   Overall Financial Resource Strain (CARDIA)    Difficulty of Paying Living Expenses: Somewhat hard  Food Insecurity: No Food Insecurity  (02/09/2023)   Hunger Vital Sign    Worried About Running Out of Food in the Last Year: Never true    Ran Out of Food in the Last Year: Never true  Transportation Needs: No Transportation Needs (02/09/2023)   PRAPARE - Administrator, Civil Service (Medical): No    Lack of Transportation (Non-Medical): No  Physical Activity: Insufficiently Active (02/09/2023)   Exercise Vital Sign    Days of Exercise per Week: 4 days    Minutes of Exercise per Session: 30 min  Stress: Stress Concern Present (02/09/2023)   Jordan Madden [employer] - Occupational Stress Questionnaire    Feeling of Stress : To some extent  Social Connections: Moderately Integrated (02/09/2023)   Social Connection and Isolation Panel    Frequency of Communication with Friends and Family: Twice a week    Frequency of Social Gatherings with Friends and Family: Once a week  Attends Religious Services: Never    Active Member of Clubs or Organizations: Yes    Attends Banker Meetings: 1 to 4 times per year    Marital Status: Married    Allergies:  Allergies  Allergen Reactions   Bee Pollen     Current Medications: Current Outpatient Medications  Medication Sig Dispense Refill   cloNIDine (CATAPRES) 0.1 MG tablet Take 1 tablet (0.1 mg total) by mouth once for 1 dose. 30 tablet 2   albuterol  (VENTOLIN  HFA) 108 (90 Base) MCG/ACT inhaler Inhale 1-2 puffs into the lungs every 6 (six) hours as needed for wheezing or shortness of breath. (Patient not taking: Reported on 03/06/2023) 18 g 0   amLODipine  (NORVASC ) 5 MG tablet Take 1 tablet (5 mg total) by mouth daily. 90 tablet 1   [START ON 08/05/2023] amphetamine -dextroamphetamine  (ADDERALL) 30 MG tablet Take 1 tablet by mouth 2 (two) times daily. 60 tablet 0   [START ON 09/04/2023] amphetamine -dextroamphetamine  (ADDERALL) 30 MG tablet Take 1 tablet by mouth 2 (two) times daily. 60 tablet 0   atorvastatin  (LIPITOR) 20 MG tablet Take 1 tablet  (20 mg total) by mouth daily. 90 tablet 1   Blood Glucose Monitoring Suppl DEVI 1 each by Does not apply route in the morning, at noon, and at bedtime. May substitute to any manufacturer covered by patient's insurance. (Patient not taking: Reported on 03/06/2023) 1 each 0   Blood Pressure Monitoring (BLOOD PRESSURE MONITOR AUTOMAT) DEVI Please check your blood pressure as directed. (Patient not taking: Reported on 03/06/2023) 1 each 0   lisinopril  (ZESTRIL ) 10 MG tablet Take 1 tablet (10 mg total) by mouth daily. 90 tablet 1   Semaglutide , 2 MG/DOSE, 8 MG/3ML SOPN Inject 2 mg as directed once a week. 3 mL 3   SUBOXONE 8-2 MG FILM SMARTSIG:2.5 Strip(s) Sublingual Daily     triamcinolone  (KENALOG ) 0.025 % ointment Apply 1 Application topically 2 (two) times daily. 30 g 0   No current facility-administered medications for this visit.     Psychiatric Specialty Exam: Review of Systems  There were no vitals taken for this visit.There is no height or weight on file to calculate BMI.  General Appearance: Well Groomed  Eye Contact:  Good  Speech:  Clear and Coherent and Normal Rate  Volume:  Normal  Mood:  Euthymic and mild anxiety  Affect:  Appropriate  Thought Process:  Coherent  Orientation:  Full (Time, Place, and Person)  Thought Content: Logical   Suicidal Thoughts:  No  Homicidal Thoughts:  No  Memory:  Immediate;   Good  Judgement:  Good  Insight:  Good  Psychomotor Activity:  Normal  Concentration:  Concentration: Good  Recall:  Good  Fund of Knowledge: Good  Language: Good  Akathisia:  NA    AIMS (if indicated): not done  Assets:  Communication Skills Desire for Improvement Financial Resources/Insurance Housing Talents/Skills Transportation Vocational/Educational  ADL's:  Intact  Cognition: WNL  Sleep:  Good   Metabolic Disorder Labs: Lab Results  Component Value Date   HGBA1C 6.4 (A) 03/09/2023   No results found for: PROLACTIN Lab Results  Component Value  Date   CHOL 132 10/17/2022   TRIG 142 10/17/2022   HDL 31 (L) 10/17/2022   CHOLHDL 4.3 10/17/2022   LDLCALC 76 10/17/2022   LDLCALC 117 (H) 08/20/2022   No results found for: TSH  Therapeutic Level Labs: No results found for: LITHIUM No results found for: VALPROATE No results found for: CBMZ  Screenings: GAD-7    Flowsheet Row Office Visit from 03/06/2023 in [facility] - A Dept Of [facility] Office Visit from 07/03/2022 in [facility]  Total GAD-7 Score 6 0   PHQ2-9    Flowsheet Row Office Visit from 03/06/2023 in [facility] - A Dept Of [facility] Office Visit from 10/17/2022 in [facility] - A Dept Of [facility] Office Visit from 07/03/2022 in [facility] Office Visit from 06/09/2022 in [facility] - A Dept Of [facility] Office Visit from 05/12/2022 in [facility] - A Dept Of [facility]  PHQ-2 Total Score 0 0 0 0 0  PHQ-9 Total Score -- -- -- 5 1   Flowsheet Row [facility] UC from 05/10/2021 in [facility] UC from 12/28/2020 in [facility] at [facility] RISK CATEGORY No Risk No Risk Error: Question 6 not populated    Collaboration of Care: Collaboration of Care: Medication Management AEB medication prescription and Primary Care Provider AEB chart review  Patient/Guardian was advised Release of Information must be obtained prior to any record release in order to collaborate their care with an outside provider. Patient/Guardian was advised if they have not already done so to contact the registration department to sign all necessary forms in order for us  to release information regarding  their care.   Consent: Patient/Guardian gives verbal consent for treatment and assignment of benefits for services provided during this visit. Patient/Guardian expressed understanding and agreed to proceed.    Arvella CHRISTELLA Finder, MD 07/14/2023, 11:17 AM   Virtual Visit via Video Note  I connected with Jordan Madden on 07/14/23 at 11:00 AM EDT by a video enabled telemedicine application and verified that I am speaking with the correct person using two identifiers.  Location: Patient: Home Provider: Home Office   I discussed the limitations of evaluation and management by telemedicine and the availability of in person appointments. The patient expressed understanding and agreed to proceed.   I discussed the assessment and treatment plan with the patient. The patient was provided an opportunity to ask questions and all were answered. The patient agreed with the plan and demonstrated an understanding of the instructions.   The patient was advised to call back or seek an in-person evaluation if the symptoms worsen or if the condition fails to improve as anticipated.  I provided 15 minutes of non-face-to-face time during this encounter.   Arvella CHRISTELLA Finder, MD

## 2023-07-14 ENCOUNTER — Encounter (HOSPITAL_COMMUNITY): Payer: Self-pay | Admitting: Psychiatry

## 2023-07-14 ENCOUNTER — Telehealth (HOSPITAL_BASED_OUTPATIENT_CLINIC_OR_DEPARTMENT_OTHER): Admitting: Psychiatry

## 2023-07-14 DIAGNOSIS — F1120 Opioid dependence, uncomplicated: Secondary | ICD-10-CM | POA: Diagnosis not present

## 2023-07-14 DIAGNOSIS — F902 Attention-deficit hyperactivity disorder, combined type: Secondary | ICD-10-CM

## 2023-07-14 DIAGNOSIS — Z79899 Other long term (current) drug therapy: Secondary | ICD-10-CM

## 2023-07-14 DIAGNOSIS — F411 Generalized anxiety disorder: Secondary | ICD-10-CM | POA: Diagnosis not present

## 2023-07-14 MED ORDER — AMPHETAMINE-DEXTROAMPHETAMINE 30 MG PO TABS: 30.0000 mg | ORAL_TABLET | Freq: Two times a day (BID) | ORAL | 0 refills | Status: AC

## 2023-07-14 MED ORDER — CLONIDINE HCL 0.1 MG PO TABS: 0.1000 mg | ORAL_TABLET | Freq: Once | ORAL | 2 refills | Status: AC

## 2023-07-23 ENCOUNTER — Ambulatory Visit (HOSPITAL_COMMUNITY)

## 2023-07-29 ENCOUNTER — Ambulatory Visit (HOSPITAL_COMMUNITY): Admitting: *Deleted

## 2023-07-29 DIAGNOSIS — F902 Attention-deficit hyperactivity disorder, combined type: Secondary | ICD-10-CM

## 2023-07-29 DIAGNOSIS — Z79899 Other long term (current) drug therapy: Secondary | ICD-10-CM

## 2023-08-01 LAB — TOXASSURE SELECT 13 (MW), URINE

## 2023-08-03 ENCOUNTER — Telehealth (HOSPITAL_COMMUNITY): Payer: Self-pay | Admitting: Psychiatry

## 2023-08-03 NOTE — Telephone Encounter (Signed)
 Attempted to contact the patient via telephone to discuss U-Tox results and plan to discontinue Adderall.  Notably patient's urine screen was positive for methamphetamine, amphetamine , cocaine, benzoylecgonine, THC, buprenorphine, and norbuprenorphine.  Notably patient has only been prescribed the buprenorphine and the amphetamine -dextroamphetamine .  Methamphetamine would not be a metabolite of amphetamine .  Furthermore there is no evidence that patient is on medications that would have accounted for positive cocaine or THC results.  This being the case we will discontinue patient's stimulant medications.  Voicemail was left for patient to call the provider back to convey this information.

## 2023-08-05 ENCOUNTER — Encounter (HOSPITAL_COMMUNITY): Payer: Self-pay

## 2023-08-05 NOTE — Telephone Encounter (Signed)
 I called the patient and informed him that his Adderall prescription has been discontinued due to a positive drug screen. Patient verbalized his understanding

## 2023-08-23 IMAGING — DX DG CHEST 2V
2 series · 2 of 2 positions shown · non-contrast
Comparison: December 28, 2020

CLINICAL DATA: Wheezing with nasal congestion and ear ache.

EXAM:
CHEST - 2 VIEW

[chest pa]
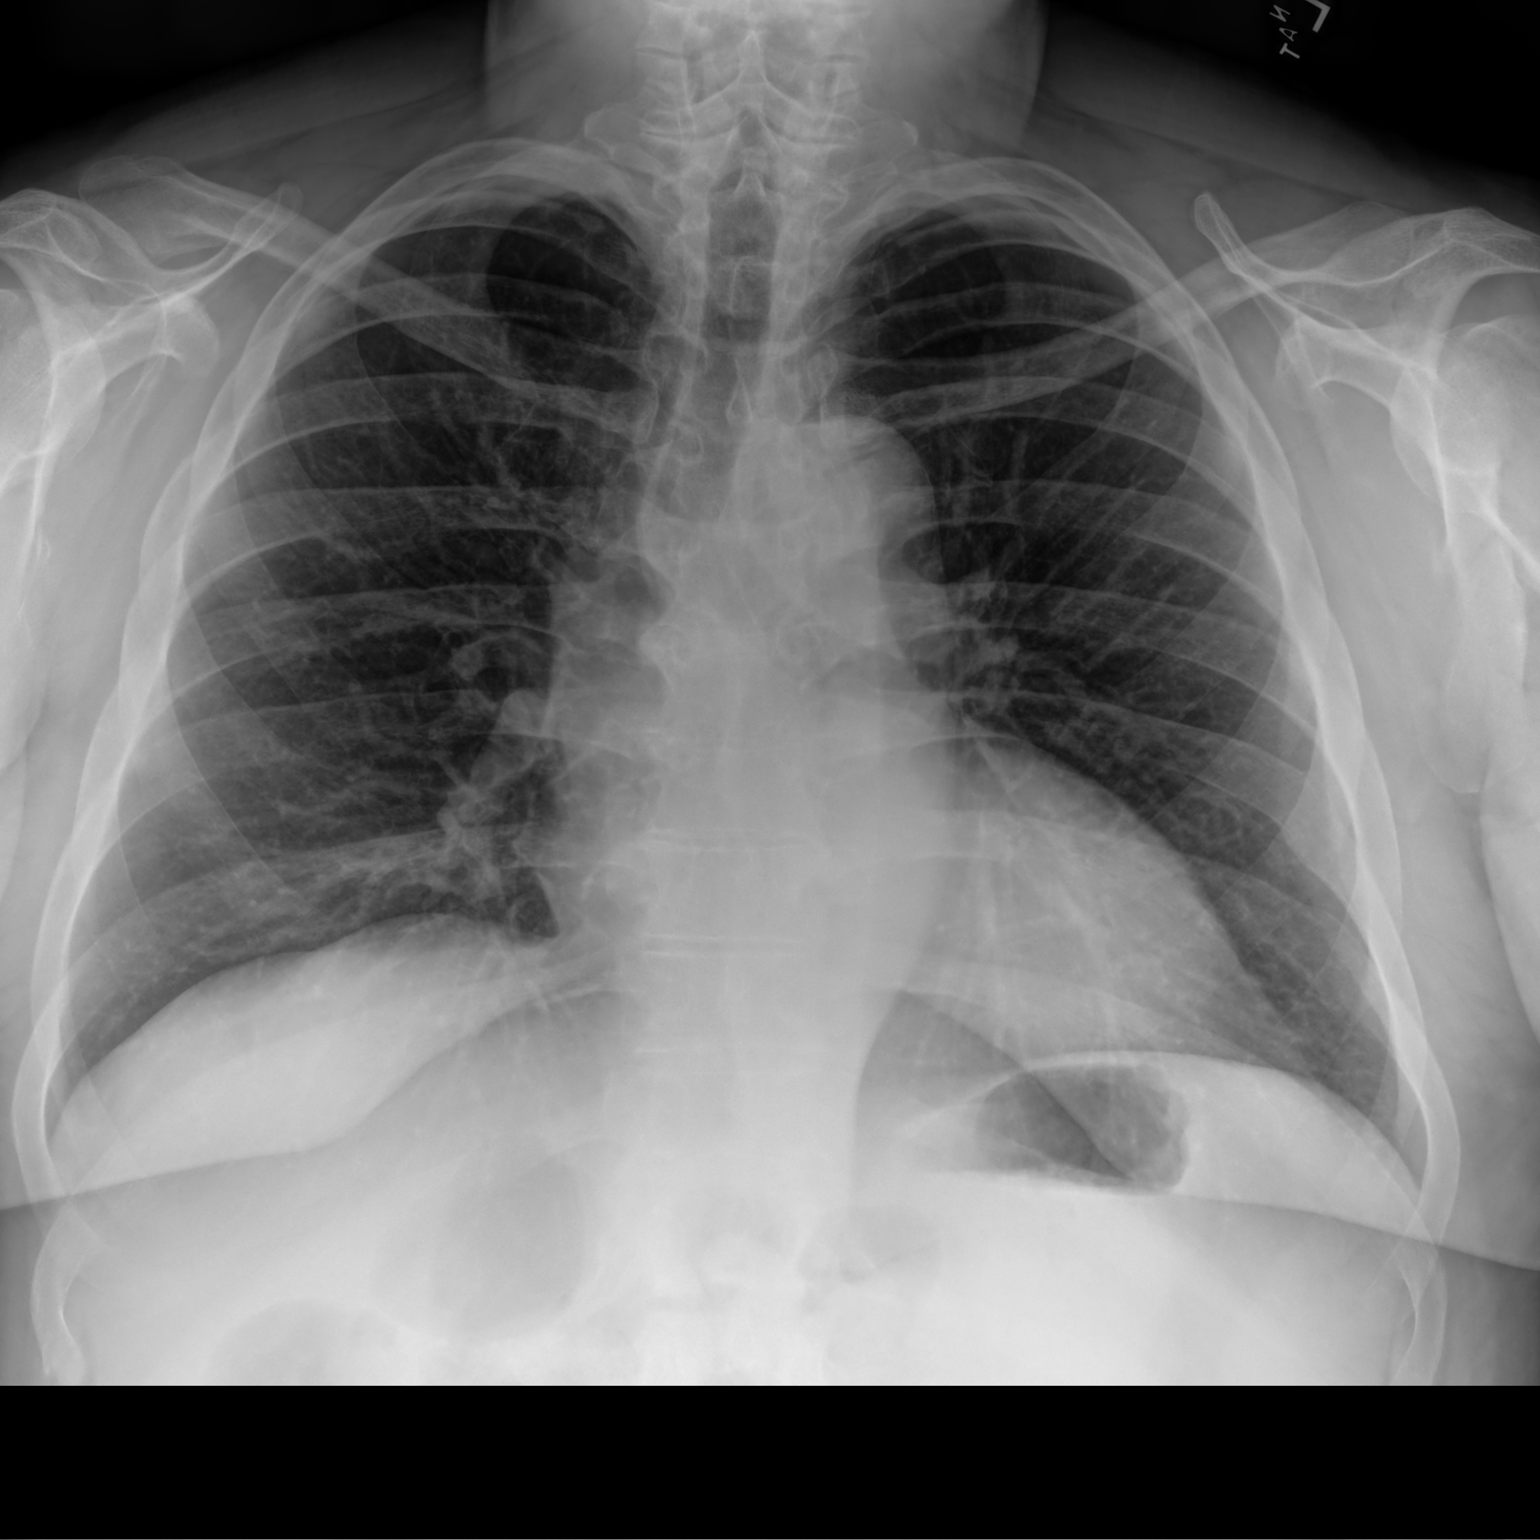

[chest lat]
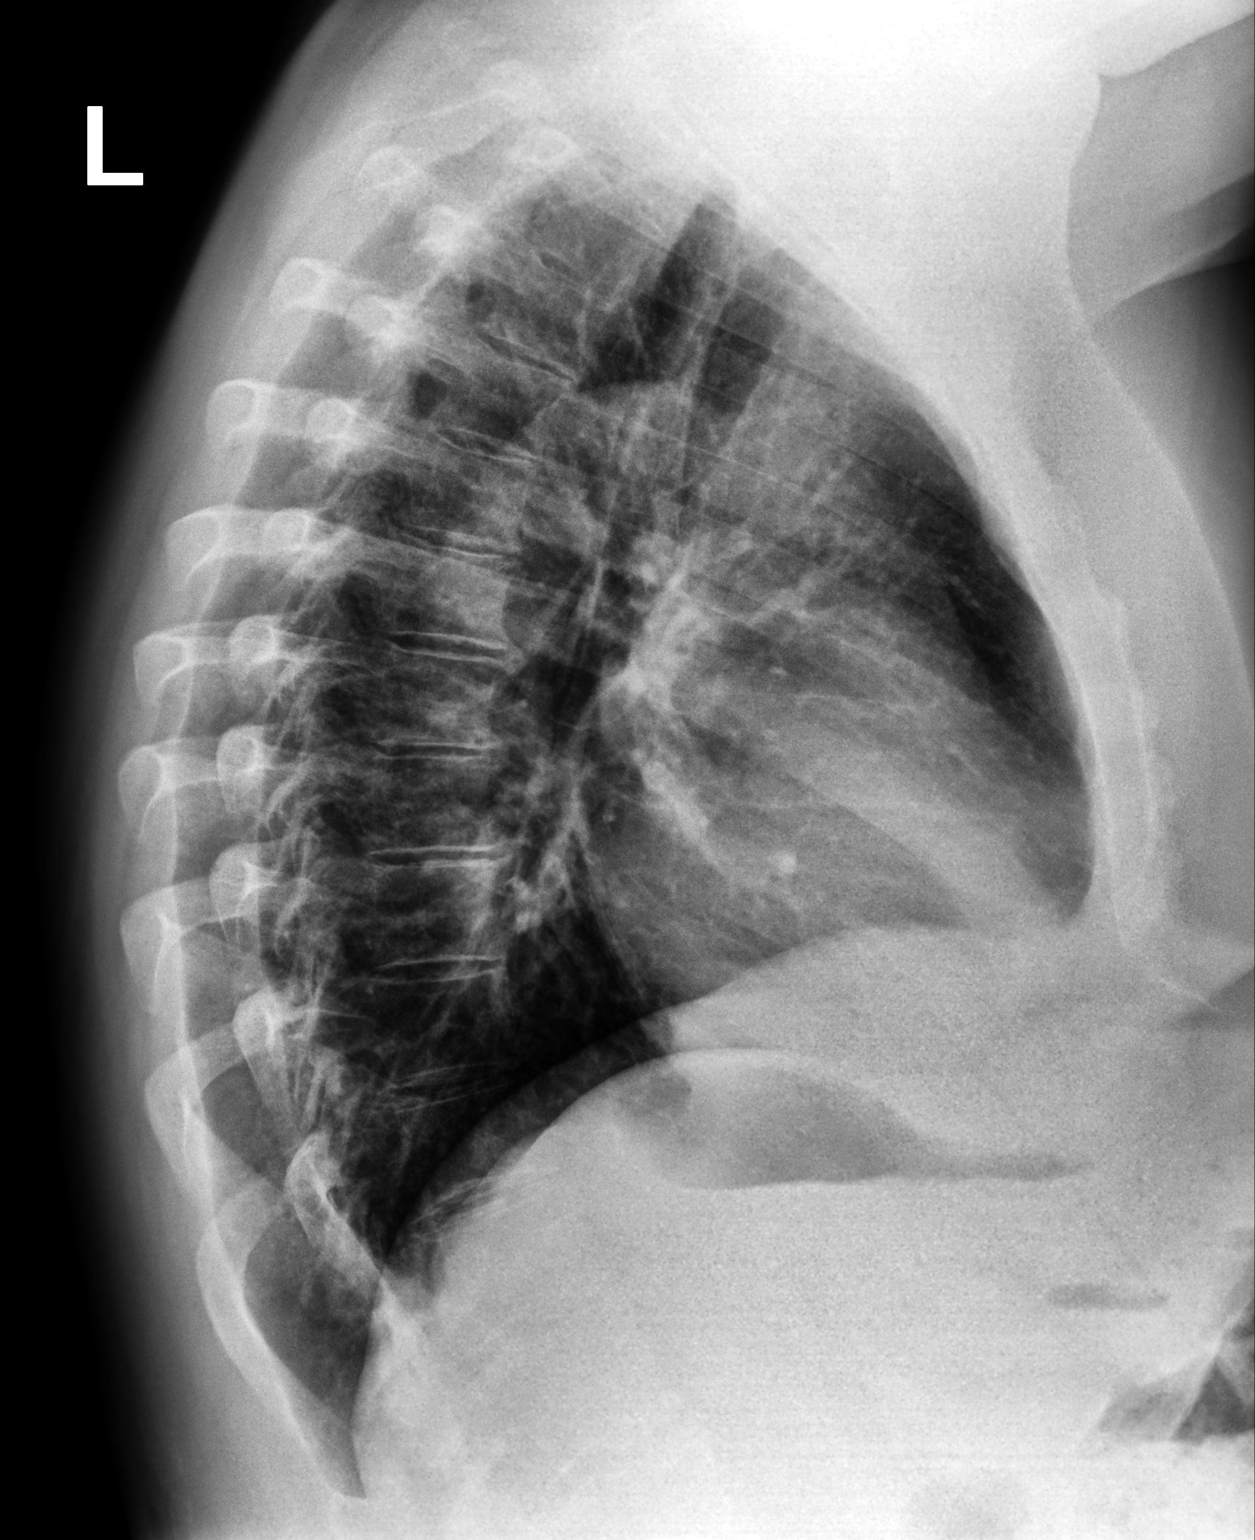

[2 of 2 positions shown; findings below may reference images not displayed]

FINDINGS: The heart size and mediastinal contours are within normal limits.
Both lungs are clear. The visualized skeletal structures are
unremarkable.
IMPRESSION: No active cardiopulmonary disease.

## 2023-08-24 ENCOUNTER — Other Ambulatory Visit: Payer: Self-pay | Admitting: Nurse Practitioner

## 2023-08-24 DIAGNOSIS — E1165 Type 2 diabetes mellitus with hyperglycemia: Secondary | ICD-10-CM

## 2023-09-09 ENCOUNTER — Other Ambulatory Visit: Payer: Self-pay | Admitting: Nurse Practitioner

## 2023-09-09 DIAGNOSIS — I10 Essential (primary) hypertension: Secondary | ICD-10-CM

## 2023-09-28 NOTE — Progress Notes (Deleted)
 BH MD/PA/NP OP Progress Note  09/28/2023 2:04 PM Jordan Madden  MRN:  [id number]  Visit Diagnosis:  No diagnosis found.  Assessment: Jordan Madden is a 43 y.o. male with a history of GAD, HTN, DM, and reported ADHD who presented to [facility] at [facility] for initial evaluation on 07/03/2022.  Patient reported a past history of anxiety and depression that has been stable since 2010.  During periods of increased anxiety he endorses symptoms of restlessness, racing thoughts, excessive worry, and fear of something awful happening.  At initial evaluation patients primary concern was his ADHD symptoms.  He discussed struggling with poor concentration/focus, difficulty completing tasks, being easily distracted, and poor memory.  He has improvement on his symptoms with stimulant medication which has been prescribed since he was 16 when he was initially diagnosed.  Patient had not completed neuropsych testing in the past.  Of note patient does have a dx of opiate use disorder which has been on Suboxone maintenance since around 2009.  Patient completed neuropsych testing which confirmed ADHD diagnosis.  Jordan Madden presents for follow-up evaluation. Today, 09/28/23, patient reports that ADHD symptoms have been well controlled on the Adderall.  He denied any adverse side effects with good sleep and appetite still.  Neuropsych testing was reviewed which confirmed ADHD diagnosis and also did show generalized anxiety disorder.  Patient had endorsed anxiety during his visit today.  Current symptoms are more situational and we will start on as needed clonidine .  Patient has used this in the past with good effect.  Risk and benefits were reviewed.  We will continue on the remainder of his current regimen and follow up in 3 months.  Patient will complete urine screen prior to next appointment.  Plan: - Continue Adderall 30 mg BID - Clonidine  0.1 mg prn for anxiety - Continue Suboxone 8-2 mg film  daily, 4-1 mg QHS, managed by Dr. Aurea - CMP and A1c reviewed - Utox ordered, patient needs to complete before next appointment - Neuropsych testing reviewed, was positive for ADHD and GAD - Crisis resources reviewed - Follow up in a 3 months  Chief Complaint:  No chief complaint on file.  HPI: Jordan Madden presents reporting  Established with provider from [facility] in [place].  Receiving some medication from them.  Unlikely to follow-up given discontinuation of stimulant by this provider when patient came back positive for marijuana, cocaine, and methamphetamine on UTOX    that thing are good. He has been experiencing a bit of anxiety lately. He finds himself getting frustrated and upset more rapidly recently. There was an incident where she ran out of medication in the interim and he felt that it was his fault.  Discussed his anxiety and patient describes it as being more situational.  That being the case we suggested starting on a as needed medication for the anxiety.  Jordan Madden agreed and has tried clonidine  in the past with good effect.  Risk and benefits of this were reviewed.  The Adderall has been working well to control his ADHD symptoms.  There was some initial period of increased symptomology when we decreased to the 30 mg twice a day dose.  However over time he is finding his to be just as effective as the 3 times a day dosing.  Reviewed urine screen and patient will get in the interim.  He expressed his understanding that he needs to happen before his next appointment.  Past Psychiatric History: Patient had followed at [facility]  in the past. His provider there let and we was not pleased with the care from his new provider. He reports that he has multiple other providers while in [place]  starting when he was 16. No prior psychiatric hospitalizations or suicide attempts. He has had multiple therapists in the past. Has gone to couples counseling in the past. Has not had it in 10  years.  Patient has been prescribed Effexor (only took short term) and Zoloft (felt lethargic) in the past.   Smokes about half pack of cigarettes daily since age 57. Patient endorses marijauna use daily. Denies any other substance use currently. Used cocaine in college. Had an opiate use disorder after college. He had developed an opiate addiction after being prescribed pain medication. Been on suboxone for 15 years now. Denies alcohol use in 15 years.  Past Medical History:  Past Medical History:  Diagnosis Date   ADHD    Anxiety    Dyslipidemia, goal LDL below 70 10/17/2022   High blood pressure    Type 2 diabetes mellitus (HCC)    No past surgical history on file.  Family History:  Family History  Problem Relation Age of Onset   Alcoholism Mother    Alcoholism Father    Colon cancer Neg Hx    Cancer - Lung Neg Hx     Social History:  Social History   Socioeconomic History   Marital status: Married    Spouse name: Not on file   Number of children: Not on file   Years of education: Not on file   Highest education level: Bachelor's degree (e.g., BA, AB, BS)  Occupational History   Not on file  Tobacco Use   Smoking status: Every Day    Current packs/day: 0.50    Types: Cigarettes   Smokeless tobacco: Never  Substance and Sexual Activity   Alcohol use: Not Currently   Drug use: Never   Sexual activity: Yes  Other Topics Concern   Not on file  Social History Narrative   Lives with his spouse    Social Drivers of Health   Financial Resource Strain: Medium Risk (02/09/2023)   Overall Financial Resource Strain (CARDIA)    Difficulty of Paying Living Expenses: Somewhat hard  Food Insecurity: No Food Insecurity (02/09/2023)   Hunger Vital Sign    Worried About Running Out of Food in the Last Year: Never true    Ran Out of Food in the Last Year: Never true  Transportation Needs: No Transportation Needs (02/09/2023)   PRAPARE - Scientist, research (physical sciences) (Medical): No    Lack of Transportation (Non-Medical): No  Physical Activity: Insufficiently Active (02/09/2023)   Exercise Vital Sign    Days of Exercise per Week: 4 days    Minutes of Exercise per Session: 30 min  Stress: Stress Concern Present (02/09/2023)   [employer] of Occupational Health - Occupational Stress Questionnaire    Feeling of Stress : To some extent  Social Connections: Moderately Integrated (02/09/2023)   Social Connection and Isolation Panel    Frequency of Communication with Friends and Family: Twice a week    Frequency of Social Gatherings with Friends and Family: Once a week    Attends Religious Services: Never    Database administrator or Organizations: Yes    Attends Banker Meetings: 1 to 4 times per year    Marital Status: Married    Allergies:  Allergies  Allergen Reactions   Bee  Pollen     Current Medications: Current Outpatient Medications  Medication Sig Dispense Refill   albuterol  (VENTOLIN  HFA) 108 (90 Base) MCG/ACT inhaler Inhale 1-2 puffs into the lungs every 6 (six) hours as needed for wheezing or shortness of breath. (Patient not taking: Reported on 03/06/2023) 18 g 0   amLODipine  (NORVASC ) 5 MG tablet Take 1 tablet (5 mg total) by mouth daily. 90 tablet 1   amphetamine -dextroamphetamine  (ADDERALL) 30 MG tablet Take 1 tablet by mouth 2 (two) times daily. 60 tablet 0   amphetamine -dextroamphetamine  (ADDERALL) 30 MG tablet Take 1 tablet by mouth 2 (two) times daily. 60 tablet 0   atorvastatin  (LIPITOR) 20 MG tablet Take 1 tablet (20 mg total) by mouth daily. 90 tablet 1   Blood Glucose Monitoring Suppl DEVI 1 each by Does not apply route in the morning, at noon, and at bedtime. May substitute to any manufacturer covered by patient's insurance. (Patient not taking: Reported on 03/06/2023) 1 each 0   Blood Pressure Monitoring (BLOOD PRESSURE MONITOR AUTOMAT) DEVI Please check your blood pressure as directed. (Patient not  taking: Reported on 03/06/2023) 1 each 0   cloNIDine  (CATAPRES ) 0.1 MG tablet Take 1 tablet (0.1 mg total) by mouth once for 1 dose. 30 tablet 2   lisinopril  (ZESTRIL ) 10 MG tablet TAKE 1 TABLET(10 MG) BY MOUTH DAILY 90 tablet 1   OZEMPIC , 2 MG/DOSE, 8 MG/3ML SOPN INJECT 2 MG INTO THE SKIN AS DIRECTED ONCE A WEEK 3 mL 3   SUBOXONE 8-2 MG FILM SMARTSIG:2.5 Strip(s) Sublingual Daily     triamcinolone  (KENALOG ) 0.025 % ointment Apply 1 Application topically 2 (two) times daily. 30 g 0   No current facility-administered medications for this visit.     Psychiatric Specialty Exam: Review of Systems  There were no vitals taken for this visit.There is no height or weight on file to calculate BMI.  General Appearance: Well Groomed  Eye Contact:  Good  Speech:  Clear and Coherent and Normal Rate  Volume:  Normal  Mood:  Euthymic and mild anxiety  Affect:  Appropriate  Thought Process:  Coherent  Orientation:  Full (Time, Place, and Person)  Thought Content: Logical   Suicidal Thoughts:  No  Homicidal Thoughts:  No  Memory:  Immediate;   Good  Judgement:  Good  Insight:  Good  Psychomotor Activity:  Normal  Concentration:  Concentration: Good  Recall:  Good  Fund of Knowledge: Good  Language: Good  Akathisia:  NA    AIMS (if indicated): not done  Assets:  Communication Skills Desire for Improvement Financial Resources/Insurance Housing Talents/Skills Transportation Vocational/Educational  ADL's:  Intact  Cognition: WNL  Sleep:  Good   Metabolic Disorder Labs: Lab Results  Component Value Date   HGBA1C 6.4 (A) 03/09/2023   No results found for: PROLACTIN Lab Results  Component Value Date   CHOL 132 10/17/2022   TRIG 142 10/17/2022   HDL 31 (L) 10/17/2022   CHOLHDL 4.3 10/17/2022   LDLCALC 76 10/17/2022   LDLCALC 117 (H) 08/20/2022   No results found for: TSH  Therapeutic Level Labs: No results found for: LITHIUM No results found for: VALPROATE No results  found for: CBMZ   Screenings: GAD-7    Flowsheet Row Office Visit from 03/06/2023 in [facility] Of [facility] Office Visit from 07/03/2022 in [facility]  Total GAD-7 Score 6 0   PHQ2-9    Flowsheet  Row Office Visit from 03/06/2023 in [facility] - A Dept Of [facility] Office Visit from 10/17/2022 in [facility] - A Dept Of [facility] Office Visit from 07/03/2022 in [facility] Office Visit from 06/09/2022 in [facility] - A Dept Of [facility] Office Visit from 05/12/2022 in [facility] - A Dept Of [facility]  PHQ-2 Total Score 0 0 0 0 0  PHQ-9 Total Score -- -- -- 5 1   Flowsheet Row [facility] UC from 05/10/2021 in [facility] UC from 12/28/2020 in [facility] RISK CATEGORY No Risk No Risk Error: Question 6 not populated    Collaboration of Care: Collaboration of Care: Medication Management AEB medication prescription and Primary Care Provider AEB chart review  Patient/Guardian was advised Release of Information must be obtained prior to any record release in order to collaborate their care with an outside provider. Patient/Guardian was advised if they have not already done so to contact the registration department to sign all necessary forms in order for us  to release information regarding their care.   Consent: Patient/Guardian gives verbal consent for treatment and assignment of benefits for services provided during this visit. Patient/Guardian expressed understanding and agreed to proceed.    Arvella CHRISTELLA Finder, MD 09/28/2023, 2:04 PM   Virtual Visit via Video Note  I  connected with Jordan Madden on 09/28/23 at 11:00 AM EDT by a video enabled telemedicine application and verified that I am speaking with the correct person using two identifiers.  Location: Patient: Home Provider: Home Office   I discussed the limitations of evaluation and management by telemedicine and the availability of in person appointments. The patient expressed understanding and agreed to proceed.   I discussed the assessment and treatment plan with the patient. The patient was provided an opportunity to ask questions and all were answered. The patient agreed with the plan and demonstrated an understanding of the instructions.   The patient was advised to call back or seek an in-person evaluation if the symptoms worsen or if the condition fails to improve as anticipated.  I provided 15 minutes of non-face-to-face time during this encounter.   Arvella CHRISTELLA Finder, MD

## 2023-09-30 ENCOUNTER — Telehealth (HOSPITAL_COMMUNITY): Payer: Self-pay | Admitting: Psychiatry

## 2023-10-01 ENCOUNTER — Encounter (HOSPITAL_COMMUNITY): Payer: Self-pay

## 2023-10-01 ENCOUNTER — Telehealth (HOSPITAL_COMMUNITY): Admitting: Psychiatry

## 2024-02-24 ENCOUNTER — Other Ambulatory Visit: Payer: Self-pay | Admitting: Nurse Practitioner

## 2024-02-24 DIAGNOSIS — I10 Essential (primary) hypertension: Secondary | ICD-10-CM

## 2024-02-24 NOTE — Telephone Encounter (Signed)
 lisinopril  (ZESTRIL ) 10 MG tablet [Pharmacy Med Name: LISINOPRIL  10MG  TABLETS]
